# Patient Record
Sex: Male | Born: 2005 | Race: Black or African American | Hispanic: No | Marital: Single | Smoking: Never smoker
Health system: Southern US, Community
[De-identification: ages and names within clinical notes are randomized; demographics above are authoritative.]

## PROBLEM LIST (undated history)

## (undated) DIAGNOSIS — N62 Hypertrophy of breast: Secondary | ICD-10-CM

## (undated) DIAGNOSIS — J302 Other seasonal allergic rhinitis: Secondary | ICD-10-CM

## (undated) DIAGNOSIS — S83519A Sprain of anterior cruciate ligament of unspecified knee, initial encounter: Secondary | ICD-10-CM

## (undated) HISTORY — PX: NO PAST SURGERIES: SHX2092

---
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
## 2023-04-13 ENCOUNTER — Encounter (HOSPITAL_BASED_OUTPATIENT_CLINIC_OR_DEPARTMENT_OTHER): Payer: Self-pay | Admitting: Student

## 2023-04-13 ENCOUNTER — Ambulatory Visit (HOSPITAL_BASED_OUTPATIENT_CLINIC_OR_DEPARTMENT_OTHER): Payer: Medicaid Other

## 2023-04-13 ENCOUNTER — Ambulatory Visit (INDEPENDENT_AMBULATORY_CARE_PROVIDER_SITE_OTHER): Payer: BC Managed Care – PPO | Admitting: Student

## 2023-04-13 DIAGNOSIS — M25561 Pain in right knee: Secondary | ICD-10-CM

## 2023-04-13 NOTE — Progress Notes (Signed)
Chief Complaint: Right knee pain     History of Present Illness:    Nathaniel Johnson is a 17 y.o. male presenting today for evaluation of right knee pain.  He was playing basketball last week and felt a pop in his knee while performing a twisting motion.  He had immediate 10/10 pain and swelling.  He states that the pain is 3/10 today and located mostly on the lateral side of the knee.  He has been taking Advil as well as icing and wearing a compression knee brace.  Has only had 1 episode of his knee buckling.  His knee also occasionally pops.  He does have history of injury to this knee with a patella dislocation about 2 years ago.  Reports that has not dislocated since.   Surgical History:   None  PMH/PSH/Family History/Social History/Meds/Allergies:    Past Medical History:  Diagnosis Date   Gynecomastia 01/01/2017   Seasonal allergies    History reviewed. No pertinent surgical history. Social History   Socioeconomic History   Marital status: Single    Spouse name: Not on file   Number of children: Not on file   Years of education: Not on file   Highest education level: Not on file  Occupational History   Not on file  Tobacco Use   Smoking status: Never   Smokeless tobacco: Never  Substance and Sexual Activity   Alcohol use: No   Drug use: No   Sexual activity: Not on file  Other Topics Concern   Not on file  Social History Narrative   Not on file   Social Determinants of Health   Financial Resource Strain: Not on file  Food Insecurity: Not on file  Transportation Needs: Not on file  Physical Activity: Not on file  Stress: Not on file  Social Connections: Not on file   Family History  Problem Relation Age of Onset   Hypertension Mother    Allergies  Allergen Reactions   Pollen Extract     hives   No current outpatient medications on file.   No current facility-administered medications for this visit.   No results  found.  Review of Systems:   A ROS was performed including pertinent positives and negatives as documented in the HPI.  Physical Exam :   Constitutional: NAD and appears stated age Neurological: Alert and oriented Psych: Appropriate affect and cooperative There were no vitals taken for this visit.   Comprehensive Musculoskeletal Exam:    Swelling noted of the right knee without obvious deformity.  There is lateral joint line tenderness.  Active range of motion 0 to 110 degrees limited by pain.  Patella is nontender and mobile.  No laxity with varus or valgus stress.  Negative Lachman's.  Positive lateral McMurray's.  Distal neurosensory exam intact.  Imaging:   Xray (right knee 4 views): Negative for fracture or dislocation.  Moderate effusion present.   I personally reviewed and interpreted the radiographs.   Assessment:   17 y.o. male with acute knee pain after a basketball injury last week.  His knee does feel stable from a ligamentous standpoint however I do have concern for possible lateral meniscus tear.  I would like to proceed at this point with MRI of the right knee for further evaluation.  Encouraged continued  use of anti-inflammatories, ice, and compression.  Recommend to avoid high intensity activities, particularly those involving twisting or quick change of direction.  Will plan to follow back up in clinic in about 4 weeks for MRI review  Plan :    -MRI of the right knee and follow-up in 4 weeks for MRI review     I personally saw and evaluated the patient, and participated in the management and treatment plan.  Hazle Nordmann, PA-C Orthopedics  This document was dictated using Conservation officer, historic buildings. A reasonable attempt at proof reading has been made to minimize errors.

## 2023-04-18 ENCOUNTER — Telehealth: Payer: Self-pay

## 2023-04-18 NOTE — Telephone Encounter (Signed)
Sending mychart msg. AS, CMA 

## 2023-04-28 ENCOUNTER — Ambulatory Visit
Admission: RE | Admit: 2023-04-28 | Discharge: 2023-04-28 | Disposition: A | Discharge status: Home / Self Care | Payer: Medicaid Other | Source: Ambulatory Visit | Attending: Student | Admitting: Student

## 2023-04-28 DIAGNOSIS — S83511A Sprain of anterior cruciate ligament of right knee, initial encounter: Secondary | ICD-10-CM | POA: Diagnosis not present

## 2023-04-28 DIAGNOSIS — M25561 Pain in right knee: Secondary | ICD-10-CM

## 2023-05-10 ENCOUNTER — Ambulatory Visit (INDEPENDENT_AMBULATORY_CARE_PROVIDER_SITE_OTHER): Payer: BC Managed Care – PPO | Admitting: Orthopaedic Surgery

## 2023-05-10 ENCOUNTER — Ambulatory Visit (HOSPITAL_BASED_OUTPATIENT_CLINIC_OR_DEPARTMENT_OTHER): Payer: Self-pay | Admitting: Orthopaedic Surgery

## 2023-05-10 DIAGNOSIS — S83511A Sprain of anterior cruciate ligament of right knee, initial encounter: Secondary | ICD-10-CM | POA: Diagnosis not present

## 2023-05-10 NOTE — Progress Notes (Signed)
Chief Complaint: Right knee pain        History of Present Illness:    05/10/2023: Today for follow-up of his right knee.  He is having persistent instability.  He is here for MRI discussion   Nathaniel Johnson is a 17 y.o. male presenting today for evaluation of right knee pain.  He was playing basketball last week and felt a pop in his knee while performing a twisting motion.  He had immediate 10/10 pain and swelling.  He states that the pain is 3/10 today and located mostly on the lateral side of the knee.  He has been taking Advil as well as icing and wearing a compression knee brace.  Has only had 1 episode of his knee buckling.  His knee also occasionally pops.  He does have history of injury to this knee with a patella dislocation about 2 years ago.  Reports that has not dislocated since.     Surgical History:   None   PMH/PSH/Family History/Social History/Meds/Allergies:         Past Medical History:  Diagnosis Date   Gynecomastia 01/01/2017   Seasonal allergies      History reviewed. No pertinent surgical history. Social History         Socioeconomic History   Marital status: Single      Spouse name: Not on file   Number of children: Not on file   Years of education: Not on file   Highest education level: Not on file  Occupational History   Not on file  Tobacco Use   Smoking status: Never   Smokeless tobacco: Never  Substance and Sexual Activity   Alcohol use: No   Drug use: No   Sexual activity: Not on file  Other Topics Concern   Not on file  Social History Narrative   Not on file    Social Determinants of Health    Financial Resource Strain: Not on file  Food Insecurity: Not on file  Transportation Needs: Not on file  Physical Activity: Not on file  Stress: Not on file  Social Connections: Not on file         Family History  Problem Relation Age of Onset   Hypertension Mother           Allergies  Allergen  Reactions   Pollen Extract        hives    No current outpatient medications on file.    No current facility-administered medications for this visit.    Imaging Results (Last 48 hours)  No results found.     Review of Systems:   A ROS was performed including pertinent positives and negatives as documented in the HPI.   Physical Exam :   Constitutional: NAD and appears stated age Neurological: Alert and oriented Psych: Appropriate affect and cooperative There were no vitals taken for this visit.    Comprehensive Musculoskeletal Exam:     Improved swelling about the right knee.  No joint line tenderness.  Active range of motion 0 to 130 degrees limited by pain.  Patella is nontender and mobile.  No laxity with varus or valgus stress.  Positive Lachman's grade 3A with positive pivot shift.  Distal neurosensory exam intact.   Imaging:   Xray (right knee 4 views): Negative  for fracture or dislocation.  Moderate effusion present.   MRI right knee: Full-thickness ACL tear I personally reviewed and interpreted the radiographs.     Assessment:   17 y.o. male with a right knee complete ACL tear after feeling a pop during a pivot injury and basketball.  I did discuss treatment options with him today.  Given the fact that he is unstable and does appear to have high aspirations of playing college basketball I do believe that right knee ACL reconstruction with quadriceps autograft give him the best option for return to play without residual instability.  I did discuss the specific limitations and restrictions associated with surgery.  We did discuss graft options and after completely exhausted discussion he is elected for quadriceps tendon autograft.   Plan :     -Plan for right knee arthroscopy with anterior cruciate ligament reconstruction with quadriceps tendon autograft       After a lengthy discussion of treatment options, including risks, benefits, alternatives, complications of  surgical and nonsurgical conservative options, the patient elected surgical repair.   The patient  is aware of the material risks  and complications including, but not limited to injury to adjacent structures, neurovascular injury, infection, numbness, bleeding, implant failure, thermal burns, stiffness, persistent pain, failure to heal, disease transmission from allograft, need for further surgery, dislocation, anesthetic risks, blood clots, risks of death,and others. The probabilities of surgical success and failure discussed with patient given their particular co-morbidities.The time and nature of expected rehabilitation and recovery was discussed.The patient's questions were all answered preoperatively.  No barriers to understanding were noted. I explained the natural history of the disease process and Rx rationale.  I explained to the patient what I considered to be reasonable expectations given their personal situation.  The final treatment plan was arrived at through a shared patient decision making process model.    I personally saw and evaluated the patient, and participated in the management and treatment plan.

## 2023-05-10 NOTE — H&P (View-Only) (Signed)
                                Chief Complaint: Right knee pain        History of Present Illness:    05/10/2023: Today for follow-up of his right knee.  He is having persistent instability.  He is here for MRI discussion   Nathaniel Johnson is a 17 y.o. male presenting today for evaluation of right knee pain.  He was playing basketball last week and felt a pop in his knee while performing a twisting motion.  He had immediate 10/10 pain and swelling.  He states that the pain is 3/10 today and located mostly on the lateral side of the knee.  He has been taking Advil as well as icing and wearing a compression knee brace.  Has only had 1 episode of his knee buckling.  His knee also occasionally pops.  He does have history of injury to this knee with a patella dislocation about 2 years ago.  Reports that has not dislocated since.     Surgical History:   None   PMH/PSH/Family History/Social History/Meds/Allergies:         Past Medical History:  Diagnosis Date   Gynecomastia 01/01/2017   Seasonal allergies      History reviewed. No pertinent surgical history. Social History         Socioeconomic History   Marital status: Single      Spouse name: Not on file   Number of children: Not on file   Years of education: Not on file   Highest education level: Not on file  Occupational History   Not on file  Tobacco Use   Smoking status: Never   Smokeless tobacco: Never  Substance and Sexual Activity   Alcohol use: No   Drug use: No   Sexual activity: Not on file  Other Topics Concern   Not on file  Social History Narrative   Not on file    Social Determinants of Health    Financial Resource Strain: Not on file  Food Insecurity: Not on file  Transportation Needs: Not on file  Physical Activity: Not on file  Stress: Not on file  Social Connections: Not on file         Family History  Problem Relation Age of Onset   Hypertension Mother           Allergies  Allergen  Reactions   Pollen Extract        hives    No current outpatient medications on file.    No current facility-administered medications for this visit.    Imaging Results (Last 48 hours)  No results found.     Review of Systems:   A ROS was performed including pertinent positives and negatives as documented in the HPI.   Physical Exam :   Constitutional: NAD and appears stated age Neurological: Alert and oriented Psych: Appropriate affect and cooperative There were no vitals taken for this visit.    Comprehensive Musculoskeletal Exam:     Improved swelling about the right knee.  No joint line tenderness.  Active range of motion 0 to 130 degrees limited by pain.  Patella is nontender and mobile.  No laxity with varus or valgus stress.  Positive Lachman's grade 3A with positive pivot shift.  Distal neurosensory exam intact.   Imaging:   Xray (right knee 4 views): Negative   for fracture or dislocation.  Moderate effusion present.   MRI right knee: Full-thickness ACL tear I personally reviewed and interpreted the radiographs.     Assessment:   17 y.o. male with a right knee complete ACL tear after feeling a pop during a pivot injury and basketball.  I did discuss treatment options with him today.  Given the fact that he is unstable and does appear to have high aspirations of playing college basketball I do believe that right knee ACL reconstruction with quadriceps autograft give him the best option for return to play without residual instability.  I did discuss the specific limitations and restrictions associated with surgery.  We did discuss graft options and after completely exhausted discussion he is elected for quadriceps tendon autograft.   Plan :     -Plan for right knee arthroscopy with anterior cruciate ligament reconstruction with quadriceps tendon autograft       After a lengthy discussion of treatment options, including risks, benefits, alternatives, complications of  surgical and nonsurgical conservative options, the patient elected surgical repair.   The patient  is aware of the material risks  and complications including, but not limited to injury to adjacent structures, neurovascular injury, infection, numbness, bleeding, implant failure, thermal burns, stiffness, persistent pain, failure to heal, disease transmission from allograft, need for further surgery, dislocation, anesthetic risks, blood clots, risks of death,and others. The probabilities of surgical success and failure discussed with patient given their particular co-morbidities.The time and nature of expected rehabilitation and recovery was discussed.The patient's questions were all answered preoperatively.  No barriers to understanding were noted. I explained the natural history of the disease process and Rx rationale.  I explained to the patient what I considered to be reasonable expectations given their personal situation.  The final treatment plan was arrived at through a shared patient decision making process model.    I personally saw and evaluated the patient, and participated in the management and treatment plan.   

## 2023-05-15 ENCOUNTER — Other Ambulatory Visit (HOSPITAL_BASED_OUTPATIENT_CLINIC_OR_DEPARTMENT_OTHER): Payer: Self-pay | Admitting: Orthopaedic Surgery

## 2023-05-15 DIAGNOSIS — S83511A Sprain of anterior cruciate ligament of right knee, initial encounter: Secondary | ICD-10-CM

## 2023-05-16 ENCOUNTER — Encounter (HOSPITAL_BASED_OUTPATIENT_CLINIC_OR_DEPARTMENT_OTHER): Payer: Self-pay

## 2023-05-21 ENCOUNTER — Other Ambulatory Visit: Payer: Self-pay

## 2023-05-21 ENCOUNTER — Encounter (HOSPITAL_BASED_OUTPATIENT_CLINIC_OR_DEPARTMENT_OTHER): Payer: Self-pay | Admitting: Orthopaedic Surgery

## 2023-05-24 NOTE — Progress Notes (Signed)

## 2023-05-28 NOTE — Anesthesia Preprocedure Evaluation (Signed)
Anesthesia Evaluation  Patient identified by MRN, date of birth, ID band Patient awake and Patient confused    Reviewed: Allergy & Precautions, H&P , NPO status , Patient's Chart, lab work & pertinent test results  Airway Mallampati: I  TM Distance: >3 FB Neck ROM: Full    Dental no notable dental hx. (+) Teeth Intact, Dental Advisory Given   Pulmonary neg pulmonary ROS   Pulmonary exam normal breath sounds clear to auscultation       Cardiovascular negative cardio ROS Normal cardiovascular exam Rhythm:Regular Rate:Normal     Neuro/Psych negative neurological ROS  negative psych ROS   GI/Hepatic negative GI ROS, Neg liver ROS,,,  Endo/Other  negative endocrine ROS    Renal/GU negative Renal ROS  negative genitourinary   Musculoskeletal negative musculoskeletal ROS (+)    Abdominal   Peds negative pediatric ROS (+)  Hematology negative hematology ROS (+)   Anesthesia Other Findings   Reproductive/Obstetrics negative OB ROS                             Anesthesia Physical Anesthesia Plan  ASA: 1  Anesthesia Plan: General and Regional   Post-op Pain Management: Regional block*, Precedex and Ofirmev IV (intra-op)*   Induction: Intravenous  PONV Risk Score and Plan: Ondansetron, Midazolam and Treatment may vary due to age or medical condition  Airway Management Planned: LMA  Additional Equipment: None  Intra-op Plan:   Post-operative Plan:   Informed Consent: I have reviewed the patients History and Physical, chart, labs and discussed the procedure including the risks, benefits and alternatives for the proposed anesthesia with the patient or authorized representative who has indicated his/her understanding and acceptance.     Dental advisory given  Plan Discussed with: CRNA and Anesthesiologist  Anesthesia Plan Comments: (GA w R adductor)        Anesthesia Quick  Evaluation

## 2023-05-29 ENCOUNTER — Ambulatory Visit (HOSPITAL_BASED_OUTPATIENT_CLINIC_OR_DEPARTMENT_OTHER): Payer: BC Managed Care – PPO | Admitting: Anesthesiology

## 2023-05-29 ENCOUNTER — Encounter (HOSPITAL_BASED_OUTPATIENT_CLINIC_OR_DEPARTMENT_OTHER)
Admission: RE | Disposition: A | Discharge status: Home / Self Care | Payer: Self-pay | Source: Home / Self Care | Attending: Orthopaedic Surgery

## 2023-05-29 ENCOUNTER — Ambulatory Visit (HOSPITAL_BASED_OUTPATIENT_CLINIC_OR_DEPARTMENT_OTHER): Payer: BC Managed Care – PPO

## 2023-05-29 ENCOUNTER — Ambulatory Visit (HOSPITAL_BASED_OUTPATIENT_CLINIC_OR_DEPARTMENT_OTHER)
Admission: RE | Admit: 2023-05-29 | Discharge: 2023-05-29 | Disposition: A | Discharge status: Home / Self Care | Payer: BC Managed Care – PPO | Attending: Orthopaedic Surgery | Admitting: Orthopaedic Surgery

## 2023-05-29 ENCOUNTER — Other Ambulatory Visit: Payer: Self-pay

## 2023-05-29 ENCOUNTER — Encounter (HOSPITAL_BASED_OUTPATIENT_CLINIC_OR_DEPARTMENT_OTHER): Payer: Self-pay | Admitting: Orthopaedic Surgery

## 2023-05-29 DIAGNOSIS — S83511A Sprain of anterior cruciate ligament of right knee, initial encounter: Secondary | ICD-10-CM | POA: Diagnosis not present

## 2023-05-29 DIAGNOSIS — X501XXA Overexertion from prolonged static or awkward postures, initial encounter: Secondary | ICD-10-CM | POA: Diagnosis not present

## 2023-05-29 DIAGNOSIS — S83261A Peripheral tear of lateral meniscus, current injury, right knee, initial encounter: Secondary | ICD-10-CM

## 2023-05-29 DIAGNOSIS — S83281A Other tear of lateral meniscus, current injury, right knee, initial encounter: Secondary | ICD-10-CM | POA: Insufficient documentation

## 2023-05-29 DIAGNOSIS — G8918 Other acute postprocedural pain: Secondary | ICD-10-CM | POA: Diagnosis not present

## 2023-05-29 DIAGNOSIS — Y9367 Activity, basketball: Secondary | ICD-10-CM | POA: Diagnosis not present

## 2023-05-29 HISTORY — DX: Sprain of anterior cruciate ligament of unspecified knee, initial encounter: S83.519A

## 2023-05-29 HISTORY — PX: KNEE ARTHROSCOPY WITH ANTERIOR CRUCIATE LIGAMENT (ACL) REPAIR WITH HAMSTRING GRAFT: SHX5645

## 2023-05-29 HISTORY — PX: KNEE ARTHROSCOPY WITH MENISCAL REPAIR: SHX5653

## 2023-05-29 SURGERY — KNEE ARTHROSCOPY WITH ANTERIOR CRUCIATE LIGAMENT (ACL) REPAIR WITH HAMSTRING GRAFT
Anesthesia: Regional | Site: Knee | Laterality: Right

## 2023-05-29 MED ORDER — ONDANSETRON HCL 4 MG/2ML IJ SOLN
4.0000 mg | Freq: Once | INTRAMUSCULAR | Status: AC
  Administered 2023-05-29: 4 mg via INTRAVENOUS

## 2023-05-29 MED ORDER — LIDOCAINE 2% (20 MG/ML) 5 ML SYRINGE
INTRAMUSCULAR | Status: AC
  Filled 2023-05-29: qty 5

## 2023-05-29 MED ORDER — OXYCODONE HCL 5 MG PO TABS: 5.0000 mg | ORAL_TABLET | Freq: Once | ORAL | Status: DC | PRN

## 2023-05-29 MED ORDER — MIDAZOLAM HCL 2 MG/2ML IJ SOLN
2.0000 mg | Freq: Once | INTRAMUSCULAR | Status: AC
  Administered 2023-05-29: 2 mg via INTRAVENOUS

## 2023-05-29 MED ORDER — LIDOCAINE 2% (20 MG/ML) 5 ML SYRINGE
INTRAMUSCULAR | Status: DC | PRN
  Administered 2023-05-29: 100 mg via INTRAVENOUS

## 2023-05-29 MED ORDER — ACETAMINOPHEN 500 MG PO TABS
ORAL_TABLET | ORAL | Status: AC
  Filled 2023-05-29: qty 2

## 2023-05-29 MED ORDER — MIDAZOLAM HCL 2 MG/2ML IJ SOLN
INTRAMUSCULAR | Status: AC
  Filled 2023-05-29: qty 2

## 2023-05-29 MED ORDER — DEXMEDETOMIDINE HCL IN NACL 80 MCG/20ML IV SOLN: INTRAVENOUS | Status: DC | PRN

## 2023-05-29 MED ORDER — SODIUM CHLORIDE 0.9 % IR SOLN
Status: DC | PRN
  Administered 2023-05-29: 9000 mL

## 2023-05-29 MED ORDER — ARTIFICIAL TEARS OPHTHALMIC OINT
TOPICAL_OINTMENT | OPHTHALMIC | Status: AC
  Filled 2023-05-29: qty 3.5

## 2023-05-29 MED ORDER — DEXAMETHASONE SODIUM PHOSPHATE 10 MG/ML IJ SOLN
INTRAMUSCULAR | Status: AC
  Filled 2023-05-29: qty 1

## 2023-05-29 MED ORDER — FENTANYL CITRATE (PF) 100 MCG/2ML IJ SOLN
INTRAMUSCULAR | Status: AC
  Filled 2023-05-29: qty 2

## 2023-05-29 MED ORDER — FENTANYL CITRATE (PF) 100 MCG/2ML IJ SOLN
100.0000 ug | Freq: Once | INTRAMUSCULAR | Status: AC
  Administered 2023-05-29: 100 ug via INTRAVENOUS

## 2023-05-29 MED ORDER — ONDANSETRON HCL 4 MG/2ML IJ SOLN
INTRAMUSCULAR | Status: AC
  Filled 2023-05-29: qty 2

## 2023-05-29 MED ORDER — PROPOFOL 10 MG/ML IV BOLUS
INTRAVENOUS | Status: DC | PRN
  Administered 2023-05-29: 20 mg via INTRAVENOUS
  Administered 2023-05-29: 180 mg via INTRAVENOUS

## 2023-05-29 MED ORDER — AMISULPRIDE (ANTIEMETIC) 5 MG/2ML IV SOLN
INTRAVENOUS | Status: AC
  Filled 2023-05-29: qty 4

## 2023-05-29 MED ORDER — PROPOFOL 10 MG/ML IV BOLUS
INTRAVENOUS | Status: AC
  Filled 2023-05-29: qty 20

## 2023-05-29 MED ORDER — ASPIRIN 325 MG PO TBEC: 325.0000 mg | DELAYED_RELEASE_TABLET | Freq: Every day | ORAL | 0 refills | Status: AC

## 2023-05-29 MED ORDER — GABAPENTIN 300 MG PO CAPS
300.0000 mg | ORAL_CAPSULE | Freq: Once | ORAL | Status: AC
  Administered 2023-05-29: 300 mg via ORAL

## 2023-05-29 MED ORDER — CEFAZOLIN SODIUM-DEXTROSE 2-4 GM/100ML-% IV SOLN
2.0000 g | INTRAVENOUS | Status: AC
  Administered 2023-05-29: 2 g via INTRAVENOUS

## 2023-05-29 MED ORDER — ACETAMINOPHEN 500 MG PO TABS: 500.0000 mg | ORAL_TABLET | Freq: Three times a day (TID) | ORAL | 0 refills | Status: AC

## 2023-05-29 MED ORDER — AMISULPRIDE (ANTIEMETIC) 5 MG/2ML IV SOLN
10.0000 mg | Freq: Once | INTRAVENOUS | Status: AC
  Administered 2023-05-29: 10 mg via INTRAVENOUS

## 2023-05-29 MED ORDER — OXYCODONE HCL 5 MG/5ML PO SOLN: 5.0000 mg | Freq: Once | ORAL | Status: DC | PRN

## 2023-05-29 MED ORDER — ACETAMINOPHEN 500 MG PO TABS
1000.0000 mg | ORAL_TABLET | Freq: Once | ORAL | Status: AC
  Administered 2023-05-29: 1000 mg via ORAL

## 2023-05-29 MED ORDER — EPHEDRINE 5 MG/ML INJ
INTRAVENOUS | Status: AC
  Filled 2023-05-29: qty 15

## 2023-05-29 MED ORDER — LACTATED RINGERS IV SOLN: INTRAVENOUS | Status: DC

## 2023-05-29 MED ORDER — TRANEXAMIC ACID-NACL 1000-0.7 MG/100ML-% IV SOLN
1000.0000 mg | INTRAVENOUS | Status: AC
  Administered 2023-05-29: 1000 mg via INTRAVENOUS

## 2023-05-29 MED ORDER — KETOROLAC TROMETHAMINE 30 MG/ML IJ SOLN: 30.0000 mg | Freq: Once | INTRAMUSCULAR | Status: DC | PRN

## 2023-05-29 MED ORDER — ONDANSETRON HCL 4 MG/2ML IJ SOLN
4.0000 mg | Freq: Once | INTRAMUSCULAR | Status: AC | PRN
  Administered 2023-05-29: 4 mg via INTRAVENOUS

## 2023-05-29 MED ORDER — IBUPROFEN 800 MG PO TABS: 800.0000 mg | ORAL_TABLET | Freq: Three times a day (TID) | ORAL | 0 refills | Status: AC

## 2023-05-29 MED ORDER — DEXAMETHASONE SODIUM PHOSPHATE 10 MG/ML IJ SOLN
INTRAMUSCULAR | Status: DC | PRN
  Administered 2023-05-29: 4 mg via INTRAVENOUS

## 2023-05-29 MED ORDER — GABAPENTIN 300 MG PO CAPS
ORAL_CAPSULE | ORAL | Status: AC
  Filled 2023-05-29: qty 1

## 2023-05-29 MED ORDER — FENTANYL CITRATE (PF) 250 MCG/5ML IJ SOLN
INTRAMUSCULAR | Status: DC | PRN
  Administered 2023-05-29: 25 ug via INTRAVENOUS
  Administered 2023-05-29: 50 ug via INTRAVENOUS
  Administered 2023-05-29: 25 ug via INTRAVENOUS

## 2023-05-29 MED ORDER — EPHEDRINE SULFATE-NACL 50-0.9 MG/10ML-% IV SOSY
PREFILLED_SYRINGE | INTRAVENOUS | Status: DC | PRN
  Administered 2023-05-29: 5 mg via INTRAVENOUS
  Administered 2023-05-29: 10 mg via INTRAVENOUS
  Administered 2023-05-29: 5 mg via INTRAVENOUS

## 2023-05-29 MED ORDER — ROPIVACAINE HCL 5 MG/ML IJ SOLN
INTRAMUSCULAR | Status: DC | PRN
  Administered 2023-05-29: 30 mL via PERINEURAL

## 2023-05-29 MED ORDER — VANCOMYCIN HCL 1000 MG IV SOLR
INTRAVENOUS | Status: DC | PRN
  Administered 2023-05-29: 1000 mg via TOPICAL

## 2023-05-29 MED ORDER — TRANEXAMIC ACID-NACL 1000-0.7 MG/100ML-% IV SOLN
INTRAVENOUS | Status: AC
  Filled 2023-05-29: qty 100

## 2023-05-29 MED ORDER — HYDROMORPHONE HCL 1 MG/ML IJ SOLN: 0.2500 mg | INTRAMUSCULAR | Status: DC | PRN

## 2023-05-29 MED ORDER — CEFAZOLIN SODIUM-DEXTROSE 2-4 GM/100ML-% IV SOLN
INTRAVENOUS | Status: AC
  Filled 2023-05-29: qty 100

## 2023-05-29 MED ORDER — DEXMEDETOMIDINE HCL IN NACL 80 MCG/20ML IV SOLN
INTRAVENOUS | Status: DC | PRN
  Administered 2023-05-29: 8 ug via INTRAVENOUS
  Administered 2023-05-29: 12 ug via INTRAVENOUS

## 2023-05-29 MED ORDER — CLONIDINE HCL (ANALGESIA) 100 MCG/ML EP SOLN
EPIDURAL | Status: DC | PRN
  Administered 2023-05-29: 100 ug

## 2023-05-29 MED ORDER — OXYCODONE HCL 5 MG PO TABS: 5.0000 mg | ORAL_TABLET | ORAL | 0 refills | Status: DC | PRN

## 2023-05-29 SURGICAL SUPPLY — 89 items
ANCH SUT 1.5 2-0 CVD FBRSTCH (Anchor) ×1 IMPLANT
ANCHOR BUTTON TIGHTROPE 14 (Anchor) IMPLANT
APL PRP STRL LF DISP 70% ISPRP (MISCELLANEOUS) ×1
BLADE SHAVER BONE 5.0X13 (MISCELLANEOUS) IMPLANT
BLADE SHAVER TORPEDO 4X13 (MISCELLANEOUS) IMPLANT
BLADE SURG 15 STRL LF DISP TIS (BLADE) ×2 IMPLANT
BLADE SURG 15 STRL SS (BLADE) ×2
BNDG CMPR 5X4 CHSV STRCH STRL (GAUZE/BANDAGES/DRESSINGS)
BNDG CMPR 5X62 HK CLSR LF (GAUZE/BANDAGES/DRESSINGS) ×1
BNDG CMPR 6"X 5 YARDS HK CLSR (GAUZE/BANDAGES/DRESSINGS) ×1
BNDG COHESIVE 4X5 TAN STRL LF (GAUZE/BANDAGES/DRESSINGS) IMPLANT
BNDG ELASTIC 6INX 5YD STR LF (GAUZE/BANDAGES/DRESSINGS) ×1 IMPLANT
CHLORAPREP W/TINT 26 (MISCELLANEOUS) ×1 IMPLANT
COOLER ICEMAN CLASSIC (MISCELLANEOUS) ×1 IMPLANT
COVER BACK TABLE 60X90IN (DRAPES) ×1 IMPLANT
CUFF TOURN SGL QUICK 34 (TOURNIQUET CUFF)
CUFF TRNQT CYL 34X4.125X (TOURNIQUET CUFF) IMPLANT
CUTTER TENSIONER SUT 2-0 0 FBW (INSTRUMENTS) IMPLANT
DISSECTOR 4.0MM X 13CM (MISCELLANEOUS) ×1 IMPLANT
DRAPE OEC MINIVIEW 54X84 (DRAPES) ×1 IMPLANT
DRAPE U-SHAPE 47X51 STRL (DRAPES) ×1 IMPLANT
DRAPE-T ARTHROSCOPY W/POUCH (DRAPES) ×1 IMPLANT
DRILL FLIPCUTTER III 6-12 (ORTHOPEDIC DISPOSABLE SUPPLIES) ×1 IMPLANT
DW OUTFLOW CASSETTE/TUBE SET (MISCELLANEOUS) ×1 IMPLANT
ELECT REM PT RETURN 9FT ADLT (ELECTROSURGICAL) ×1
ELECTRODE REM PT RTRN 9FT ADLT (ELECTROSURGICAL) ×1 IMPLANT
FIBERSTICK 2 (SUTURE) ×1 IMPLANT
FLIPCUTTER III 6-12 AR-1204FF (ORTHOPEDIC DISPOSABLE SUPPLIES)
GAUZE SPONGE 4X4 12PLY STRL (GAUZE/BANDAGES/DRESSINGS) ×1 IMPLANT
GAUZE XEROFORM 1X8 LF (GAUZE/BANDAGES/DRESSINGS) ×1 IMPLANT
GLOVE BIO SURGEON STRL SZ 6 (GLOVE) ×2 IMPLANT
GLOVE BIO SURGEON STRL SZ7.5 (GLOVE) ×1 IMPLANT
GLOVE BIOGEL PI IND STRL 6.5 (GLOVE) ×1 IMPLANT
GLOVE BIOGEL PI IND STRL 8 (GLOVE) ×1 IMPLANT
GOWN STRL REUS W/ TWL LRG LVL3 (GOWN DISPOSABLE) ×2 IMPLANT
GOWN STRL REUS W/TWL LRG LVL3 (GOWN DISPOSABLE) ×2
GOWN STRL REUS W/TWL XL LVL3 (GOWN DISPOSABLE) ×1 IMPLANT
HARVESTER TENDON QUADPRO 11 (ORTHOPEDIC DISPOSABLE SUPPLIES) IMPLANT
HARVESTER TENDON QUADPRO 8 (ORTHOPEDIC DISPOSABLE SUPPLIES) IMPLANT
HARVESTER TENDON QUADPRO 9 (ORTHOPEDIC DISPOSABLE SUPPLIES) IMPLANT
IMMOBILIZER KNEE 20 (SOFTGOODS)
IMMOBILIZER KNEE 20 THIGH 36 (SOFTGOODS) IMPLANT
IMMOBILIZER KNEE 22 UNIV (SOFTGOODS) IMPLANT
IMP SYS 2ND FIX PEEK 4.75X19.1 (Miscellaneous) ×1 IMPLANT
IMPL FIBERSTITCH 1.5 CVD (Anchor) IMPLANT
IMPL QUADLINK SYSTEM 10 (Orthopedic Implant) IMPLANT
IMPL SYS 2ND FX PEEK 4.75X19.1 (Miscellaneous) IMPLANT
IMPLANT FIBERSTITCH 1.5 CVD (Anchor) ×1 IMPLANT
IMPLANT QUADLINK SYSTEM 10 (Orthopedic Implant) ×1 IMPLANT
IV NS IRRIG 3000ML ARTHROMATIC (IV SOLUTION) ×4 IMPLANT
KIT TRANSTIBIAL (DISPOSABLE) ×1 IMPLANT
KNIFE GRAFT ACL 10MM 5952 (MISCELLANEOUS) IMPLANT
KNIFE GRAFT ACL 9MM (MISCELLANEOUS) IMPLANT
NDL SUT 2-0 SCORPION KNEE (NEEDLE) ×1 IMPLANT
NEEDLE SUT 2-0 SCORPION KNEE (NEEDLE) ×2 IMPLANT
NS IRRIG 1000ML POUR BTL (IV SOLUTION) ×1 IMPLANT
PACK ARTHROSCOPY DSU (CUSTOM PROCEDURE TRAY) ×1 IMPLANT
PACK BASIN DAY SURGERY FS (CUSTOM PROCEDURE TRAY) IMPLANT
PAD CAST 4YDX4 CTTN HI CHSV (CAST SUPPLIES) ×1 IMPLANT
PAD COLD SHLDR WRAP-ON (PAD) ×1 IMPLANT
PADDING CAST COTTON 4X4 STRL (CAST SUPPLIES) ×1
PENCIL SMOKE EVACUATOR (MISCELLANEOUS) ×1 IMPLANT
PORT APPOLLO RF 90DEGREE MULTI (SURGICAL WAND) ×1 IMPLANT
PORTAL SKID DEVICE (INSTRUMENTS) IMPLANT
SHEET MEDIUM DRAPE 40X70 STRL (DRAPES) ×1 IMPLANT
SLEEVE SCD COMPRESS KNEE MED (STOCKING) ×1 IMPLANT
SPIKE FLUID TRANSFER (MISCELLANEOUS) IMPLANT
SPONGE T-LAP 4X18 ~~LOC~~+RFID (SPONGE) ×1 IMPLANT
SUT 0 FIBERLOOP 38 BLUE TPR ND (SUTURE)
SUT 2 FIBERLOOP 20 STRT BLUE (SUTURE) ×2
SUT ETHILON 3 0 PS 1 (SUTURE) ×2 IMPLANT
SUT VIC AB 0 CT1 27 (SUTURE) ×2
SUT VIC AB 0 CT1 27XBRD ANBCTR (SUTURE) ×3 IMPLANT
SUT VIC AB 1 CT1 27 (SUTURE) ×1
SUT VIC AB 1 CT1 27XBRD ANBCTR (SUTURE) ×1 IMPLANT
SUT VIC AB 2-0 PS2 27 (SUTURE) ×2 IMPLANT
SUT VIC AB 2-0 SH 27 (SUTURE) ×2
SUT VIC AB 2-0 SH 27XBRD (SUTURE) IMPLANT
SUT VIC AB 3-0 SH 27 (SUTURE)
SUT VIC AB 3-0 SH 27X BRD (SUTURE) IMPLANT
SUTURE 0 FIBERLP 38 BLU TPR ND (SUTURE) ×1 IMPLANT
SUTURE 2 FIBERLOOP 20 STRT BLU (SUTURE) IMPLANT
SUTURE TAPE TIGERLINK 1.3MM BL (SUTURE) IMPLANT
SUTURETAPE TIGERLINK 1.3MM BL (SUTURE)
SYR BULB IRRIG 60ML STRL (SYRINGE) ×1 IMPLANT
TOWEL GREEN STERILE FF (TOWEL DISPOSABLE) ×2 IMPLANT
TUBE CONNECTING 20X1/4 (TUBING) ×1 IMPLANT
TUBING ARTHROSCOPY IRRIG 16FT (MISCELLANEOUS) ×1 IMPLANT
YANKAUER SUCT BULB TIP NO VENT (SUCTIONS) ×1 IMPLANT

## 2023-05-29 NOTE — Interval H&P Note (Signed)
History and Physical Interval Note:  05/29/2023 9:10 AM  Nathaniel Johnson  has presented today for surgery, with the diagnosis of RIGHT KNEE ANTERIOR CRUCIATE LIGAMENT TEAR.  The various methods of treatment have been discussed with the patient and family. After consideration of risks, benefits and other options for treatment, the patient has consented to  Procedure(s): RIGHT KNEE ARTHROSCOPY WITH ANTERIOR CRUCIATE LIGAMENT (ACL) RECONSTRUCTION WITH QUADRICEPS AUTOGRAFT (Right) as a surgical intervention.  The patient's history has been reviewed, patient examined, no change in status, stable for surgery.  I have reviewed the patient's chart and labs.  Questions were answered to the patient's satisfaction.     [name]

## 2023-05-29 NOTE — Brief Op Note (Signed)
   Brief Op Note  Date of Surgery: 05/29/2023  Preoperative Diagnosis: RIGHT KNEE ANTERIOR CRUCIATE LIGAMENT TEAR  Postoperative Diagnosis: same  Procedure: Procedure(s): RIGHT KNEE ARTHROSCOPY WITH ANTERIOR CRUCIATE LIGAMENT (ACL) RECONSTRUCTION WITH QUADRICEPS AUTOGRAFT KNEE ARTHROSCOPY WITH MENISCAL REPAIR LATERAL  Implants: Implant Name Type Inv. Item Serial No. Manufacturer Lot No. LRB No. Used Action  IMPLANT FIBERSTITCH 1.5 CVD - [id number] Anchor IMPLANT FIBERSTITCH 1.5 CVD  [employer] [id number] Right 1 Implanted    Surgeons: Surgeon(s): [name], MD  Anesthesia: General    Estimated Blood Loss: See anesthesia record  Complications: None  Condition to PACU: Stable  Benancio Deeds, MD 05/29/2023 1:41 PM

## 2023-05-29 NOTE — Anesthesia Procedure Notes (Signed)
Anesthesia Regional Block: Adductor canal block   Pre-Anesthetic Checklist: , timeout performed,  Correct Patient, Correct Site, Correct Laterality,  Correct Procedure, Correct Position, site marked,  Risks and benefits discussed,  Surgical consent,  Pre-op evaluation,  At surgeon's request and post-op pain management  Laterality: Lower and Right  Prep: chloraprep       Needles:  Injection technique: Single-shot  Needle Type: Echogenic Needle     Needle Length: 9cm  Needle Gauge: 22     Additional Needles:   Procedures:,,,, ultrasound used (permanent image in chart),,    Narrative:  Start time: 05/29/2023 9:45 AM End time: 05/29/2023 9:50 AM Injection made incrementally with aspirations every 5 mL.  Performed by: Personally  Anesthesiologist: Trevor Iha, MD  Additional Notes: Block assessed prior to surgery. Pt tolerated procedure well.

## 2023-05-29 NOTE — Transfer of Care (Signed)
Immediate Anesthesia Transfer of Care Note  Patient: MAK BONNY  Procedure(s) Performed: RIGHT KNEE ARTHROSCOPY WITH ANTERIOR CRUCIATE LIGAMENT (ACL) RECONSTRUCTION WITH QUADRICEPS AUTOGRAFT (Right: Knee) KNEE ARTHROSCOPY WITH MENISCAL REPAIR LATERAL (Right: Knee)  Patient Location: PACU  Anesthesia Type:GA combined with regional for post-op pain  Level of Consciousness: awake, alert , and oriented  Airway & Oxygen Therapy: Patient Spontanous Breathing and Patient connected to face mask oxygen  Post-op Assessment: Report given to RN and Post -op Vital signs reviewed and stable  Post vital signs: Reviewed and stable  Last Vitals:  Vitals Value Taken Time  BP    Temp    Pulse    Resp    SpO2      Last Pain:  Vitals:   05/29/23 0905  PainSc: 0-No pain      Patients Stated Pain Goal: 3 (05/29/23 0905)  Complications: No notable events documented.

## 2023-05-29 NOTE — Discharge Instructions (Addendum)
Discharge Instructions    Attending Surgeon: [name], MD Office Phone Number: [phone]   Diagnosis and Procedures:    Surgeries Performed: Right knee ACL reconstruction with lateral meniscal repair  Discharge Plan:    Diet: Resume usual diet. Begin with light or bland foods.  Drink plenty of fluids.  Activity:  Weight bearing as tolerated right leg with brace locked in extension. You are advised to go home directly from the hospital or surgical center. Restrict your activities.  GENERAL INSTRUCTIONS: 1.  Please apply ice to your wound to help with swelling and inflammation. This will improve your comfort and your overall recovery following surgery.     2. Please call Dr. Serena Croissant office at [phone] with questions Monday-Friday during business hours. If no one answers, please leave a message and someone should get back to the patient within 24 hours. For emergencies please call 911 or proceed to the emergency room.   3. Patient to notify surgical team if experiences any of the following: Bowel/Bladder dysfunction, uncontrolled pain, nerve/muscle weakness, incision with increased drainage or redness, nausea/vomiting and Fever greater than 101.0 F.  Be alert for signs of infection including redness, streaking, odor, fever or chills. Be alert for excessive pain or bleeding and notify your surgeon immediately.  WOUND INSTRUCTIONS:   Leave your dressing, cast, or splint in place until your post operative visit.  Keep it clean and dry.  Always keep the incision clean and dry until the staples/sutures are removed. If there is no drainage from the incision you should keep it open to air. If there is drainage from the incision you must keep it covered at all times until the drainage stops  Do not soak in a bath tub, hot tub, pool, lake or other body of water until 21 days after your surgery and your incision is completely dry and healed.  If you have removable sutures  (or staples) they must be removed 10-14 days (unless otherwise instructed) from the day of your surgery.     1)  Elevate the extremity as much as possible.  2)  Keep the dressing clean and dry.  3)  Please call us if the dressing becomes wet or dirty.  4)  If you are experiencing worsening pain or worsening swelling, please call.     MEDICATIONS: Resume all previous home medications at the previous prescribed dose and frequency unless otherwise noted Start taking the  pain medications on an as-needed basis as prescribed  Please taper down pain medication over the next week following surgery.  Ideally you should not require a refill of any narcotic pain medication.  Take pain medication with food to minimize nausea. In addition to the prescribed pain medication, you may take over-the-counter pain relievers such as Tylenol.  Do NOT take additional tylenol if your pain medication already has tylenol in it.  Aspirin 325mg  daily for four weeks.      FOLLOWUP INSTRUCTIONS: 1. Follow up at the Physical Therapy Clinic 3-4 days following surgery. This appointment should be scheduled unless other arrangements have been made.The Physical Therapy scheduling number is [phone] if an appointment has not already been arranged.  2. Contact Dr. Serena Croissant office during office hours at [phone] or the practice after hours line at [phone] for non-emergencies. For medical emergencies call 911.   Discharge Location: Home   May take Tylenol after 3pm, if needed.   Post Anesthesia Home Care Instructions  Activity: Get plenty of rest for  the remainder of the day. A responsible individual must stay with you for 24 hours following the procedure.  For the next 24 hours, DO NOT: -Drive a car -Advertising copywriter -Drink alcoholic beverages -Take any medication unless instructed by your physician -Make any legal decisions or sign important papers.  Meals: Start with liquid foods such as  gelatin or soup. Progress to regular foods as tolerated. Avoid greasy, spicy, heavy foods. If nausea and/or vomiting occur, drink only clear liquids until the nausea and/or vomiting subsides. Call your physician if vomiting continues.  Special Instructions/Symptoms: Your throat may feel dry or sore from the anesthesia or the breathing tube placed in your throat during surgery. If this causes discomfort, gargle with warm salt water. The discomfort should disappear within 24 hours.  If you had a scopolamine patch placed behind your ear for the management of post- operative nausea and/or vomiting:  1. The medication in the patch is effective for 72 hours, after which it should be removed.  Wrap patch in a tissue and discard in the trash. Wash hands thoroughly with soap and water. 2. You may remove the patch earlier than 72 hours if you experience unpleasant side effects which may include dry mouth, dizziness or visual disturbances. 3. Avoid touching the patch. Wash your hands with soap and water after contact with the patch.    Post Anesthesia Home Care Instructions  Activity: Get plenty of rest for the remainder of the day. A responsible individual must stay with you for 24 hours following the procedure.  For the next 24 hours, DO NOT: -Drive a car -Advertising copywriter -Drink alcoholic beverages -Take any medication unless instructed by your physician -Make any legal decisions or sign important papers.  Meals: Start with liquid foods such as gelatin or soup. Progress to regular foods as tolerated. Avoid greasy, spicy, heavy foods. If nausea and/or vomiting occur, drink only clear liquids until the nausea and/or vomiting subsides. Call your physician if vomiting continues.  Special Instructions/Symptoms: Your throat may feel dry or sore from the anesthesia or the breathing tube placed in your throat during surgery. If this causes discomfort, gargle with warm salt water. The discomfort should  disappear within 24 hours.  If you had a scopolamine patch placed behind your ear for the management of post- operative nausea and/or vomiting:  1. The medication in the patch is effective for 72 hours, after which it should be removed.  Wrap patch in a tissue and discard in the trash. Wash hands thoroughly with soap and water. 2. You may remove the patch earlier than 72 hours if you experience unpleasant side effects which may include dry mouth, dizziness or visual disturbances. 3. Avoid touching the patch. Wash your hands with soap and water after contact with the patch.    Regional Anesthesia Blocks  1. Numbness or the inability to move the "blocked" extremity may last from 3-48 hours after placement. The length of time depends on the medication injected and your individual response to the medication. If the numbness is not going away after 48 hours, call your surgeon.  2. The extremity that is blocked will need to be protected until the numbness is gone and the  Strength has returned. Because you cannot feel it, you will need to take extra care to avoid injury. Because it may be weak, you may have difficulty moving it or using it. You may not know what position it is in without looking at it while the block is in  effect.  3. For blocks in the legs and feet, returning to weight bearing and walking needs to be done carefully. You will need to wait until the numbness is entirely gone and the strength has returned. You should be able to move your leg and foot normally before you try and bear weight or walk. You will need someone to be with you when you first try to ensure you do not fall and possibly risk injury.  4. Bruising and tenderness at the needle site are common side effects and will resolve in a few days.  5. Persistent numbness or new problems with movement should be communicated to the surgeon or the [facility] [phone] [facility] [phone]).Regional  Anesthesia Blocks  1. Numbness or the inability to move the "blocked" extremity may last from 3-48 hours after placement. The length of time depends on the medication injected and your individual response to the medication. If the numbness is not going away after 48 hours, call your surgeon.  2. The extremity that is blocked will need to be protected until the numbness is gone and the  Strength has returned. Because you cannot feel it, you will need to take extra care to avoid injury. Because it may be weak, you may have difficulty moving it or using it. You may not know what position it is in without looking at it while the block is in effect.  3. For blocks in the legs and feet, returning to weight bearing and walking needs to be done carefully. You will need to wait until the numbness is entirely gone and the strength has returned. You should be able to move your leg and foot normally before you try and bear weight or walk. You will need someone to be with you when you first try to ensure you do not fall and possibly risk injury.  4. Bruising and tenderness at the needle site are common side effects and will resolve in a few days.  5. Persistent numbness or new problems with movement should be communicated to the surgeon or the [facility] [phone] [facility] [phone]).

## 2023-05-29 NOTE — Progress Notes (Signed)
Assisted Dr. Houser with right, adductor canal, ultrasound guided block. Side rails up, monitors on throughout procedure. See vital signs in flow sheet. Tolerated Procedure well. 

## 2023-05-29 NOTE — Anesthesia Postprocedure Evaluation (Signed)
Anesthesia Post Note  Patient: Nathaniel Johnson  Procedure(s) Performed: RIGHT KNEE ARTHROSCOPY WITH ANTERIOR CRUCIATE LIGAMENT (ACL) RECONSTRUCTION WITH QUADRICEPS AUTOGRAFT (Right: Knee) KNEE ARTHROSCOPY WITH MENISCAL REPAIR LATERAL (Right: Knee)     Patient location during evaluation: PACU Anesthesia Type: Regional and General Level of consciousness: awake and alert Pain management: pain level controlled Vital Signs Assessment: post-procedure vital signs reviewed and stable Respiratory status: spontaneous breathing, nonlabored ventilation, respiratory function stable and patient connected to nasal cannula oxygen Cardiovascular status: blood pressure returned to baseline and stable Postop Assessment: no apparent nausea or vomiting Anesthetic complications: no   No notable events documented.  Last Vitals:  Vitals:   05/29/23 1500 05/29/23 1526  BP:  (!) 158/82  Pulse: (!) 50 (!) 59  Resp:  16  Temp:  36.4 C  SpO2: 100% 100%    Last Pain:  Vitals:   05/29/23 1526  PainSc: 0-No pain                 Trevor Iha

## 2023-05-29 NOTE — Anesthesia Procedure Notes (Signed)
Procedure Name: LMA Insertion Date/Time: 05/29/2023 11:52 AM  Performed by: Demetrio Lapping, CRNAPre-anesthesia Checklist: Patient identified, Emergency Drugs available, Suction available and Patient being monitored Patient Re-evaluated:Patient Re-evaluated prior to induction Oxygen Delivery Method: Circle System Utilized Preoxygenation: Pre-oxygenation with 100% oxygen Induction Type: IV induction Ventilation: Mask ventilation without difficulty LMA: LMA inserted LMA Size: 5.0 Number of attempts: 1 Airway Equipment and Method: Bite block Placement Confirmation: positive ETCO2 Tube secured with: Tape Dental Injury: Teeth and Oropharynx as per pre-operative assessment

## 2023-05-29 NOTE — Progress Notes (Signed)
Orthopedic Tech Progress Note Patient Details:  [name] June 23, 2006 [id number] RN at day surgery called requesting a BLEDSOE BRACE for patient. Called in order stat to [facility]    Patient ID: Nathaniel Johnson, male   DOB: Apr 12, 2006, 17 y.o.   MRN: [id number]  Donald Pore 05/29/2023, 12:48 PM

## 2023-05-29 NOTE — Op Note (Signed)
Date of Surgery: 05/29/2023  INDICATIONS: [name] is a 17 y.o.-year-old male with complete ACL tear and lateral meniscal tear.  The risk and benefits of the procedure were discussed in detail and documented in the pre-operative evaluation.   PREOPERATIVE DIAGNOSIS: 1. Right knee complete anterior cruciate ligament tear 2.  Right knee lateral meniscal tear  POSTOPERATIVE DIAGNOSIS: Same.  PROCEDURE: 1.  Right knee anterior cruciate ligament reconstruction with quadriceps tendon autograft. 2.  Right knee lateral meniscal repair  SURGEON: Benancio Deeds MD  ASSISTANT: Kerby Less, ATC  ANESTHESIA:  general plus adductor canal block  IV FLUIDS AND URINE: See anesthesia record.  ANTIBIOTICS: Ancef  ESTIMATED BLOOD LOSS: 50 mL.  IMPLANTS:  Implant Name Type Inv. Item Serial No. Manufacturer Lot No. LRB No. Used Action  IMPLANT FIBERSTITCH 1.5 CVD - [id number] Anchor IMPLANT FIBERSTITCH 1.5 CVD  [id number] Right 1 Implanted    DRAINS: None  CULTURES: None  COMPLICATIONS: none  DESCRIPTION OF PROCEDURE:  Examination under anesthesia: A careful examination under anesthesia was performed.  Knee ROM motion was: -3  - 130 Lachman: Grade 3B Pivot Shift: Positive Posterior drawer: normal Varus stability in full extension: normal Varus stability in 30 degrees of flexion: normal Valgus stability in full extension: normal Valgus stability in 30 degrees of flexion: normal Posterolateral drawer: normal   Intra-operative findings: A thorough arthroscopic examination of the knee was performed.  The findings are: 1. Suprapatellar pouch: Normal 2. Undersurface of median ridge: Normal 3. Medial patellar facet: Normal 4. Lateral patellar facet: Normal 5. Trochlea: Normal 6. Lateral gutter/popliteus tendon: Normal 7. Hoffa's fat pad: Normal 8. Medial gutter/plica: Normal 9. ACL: Complete tear 10. PCL: Normal 11. Medial meniscus: Normal 12. Medial compartment cartilage:  Normal 13. Lateral meniscus: Peripheral tear involving the posterior third but not involving the root 14. Lateral compartment cartilage: Normal      I identified the patient in the pre-operative holding area.  I marked the operative knee with my initials. I reviewed the risks and benefits of the proposed surgical intervention and the patient wished to proceed. Anesthesia was then performed with regional block.  The patient was transferred to the operative suite and placed in the supine position with all bony prominences padded.     SCDs were placed on the non-operative lower extremity. Appropriate antibiotics was administered within 1 hour before incision. The operative extremity was then prepped and draped in standard fashion. A time out was performed confirming the correct extremity, correct patient and correct procedure. A tourniquet was placed but not inflated.  Final timeout was performed. Arthroscopy portals were marked and portals were established with an 11 blade.  A diagnostic arthroscopy was performed with findings above. The femoral and tibial ACL footprints were debrided of tissue and prepared for tunnel placement.     There was a tear of the lateral meniscus involving the posterior third peripherally but not into the root   This was probed and given the fact that this came forward it was deemed unstable.  At this time and all inside fiber stitch implant was used in a mattress type fashion into the tear in order to approximate this.  This was reprobed without any evidence of instability.   The quad tendon graft harvest was done next.  A 5-cm vertical incision was made over the quadriceps insertion. Care was taken not to extend proximally greater than 8cm to avoid disruption of muscle belly. The paratenon was resected. The Arthrex double blade  9 mm quad tendon graft cutter was used to cut a strip of quadriceps tendon. A traction was held with an alice clamp. A quadriceps tendon  stripper/cutter was used to amputate the proximal graft at 65 mm with care not to violate the VMO muscle.    The free quadriceps tendon graft was placed on the Graft Station. The Graft Prep Attachments were placed on the base and the SpeedWhip Rip-Stop technique was employed to secure the suture/tissue interface of the TightRopes with a FiberTag scaffold. A TightRope was used on the quadriceps graft for femoral fixation and a TightRope ABS for tibial fixation.   The graft measured 10.5 mm on the femoral side and 11 mm on tibial side.  At this time the retrograde flip cutter guide was introduced into the lateral femoral condyle at the center of the anatomic femoral ACL footprint.  This was drilled for socket depth of 20 mm at a width of 10.5 mm.  A fiber stick was passed and a passing suture was passed through the tunnel.  Arthroscopy confirmed intact backwall.   The tibial tunnel was created with an 11 mm barrel reamer drill at 55 degree obliquity in the coronal plane, centered at the ACL footprint as determined by the anterior horn of the lateral meniscus through an incision in the anteromedial tibia.  Care was taken to again not converge with our previous tunnel from the lateral meniscal root repair.  The graft was then pulled into the knee with a traction suture. The ACL TightRope button was confirmed to be deep to the IT band using fluoroscopy.  The graft was then pulled into place with the white suture until parked in the femoral tunnel.     The graft was then affixed distally under tension with the knee in extension. The TightRope ABS Button was loaded onto the loop. The white shortening strands were pulled in alternating fashion to advance the button to bone and tension the graft. The knee was then cycled and further tightened on the femoral and tibial sides. The graft was probed and found to be taut at all angles of flexion.  Lachman examination improved to 1A.  The proximal fixation/TightRope was  tightened one last time to ensure maximal tightness and then excess suture removed with a knot cutter. The TightRope tails were fixed to the anterior tibia with a 4.75 mm SwiveLock distal to the ABS button for secondary fixation.   The quadriceps tendon harvest incision was closed with 0- Vicryl in the quad tendon, and and interrupted inverted 2-0 Vicryl in subcutaneous tissue and a 3-0 nylon in the skin.  The other incisions were closed superficially with 2-0 vicryl and 3-0 nylon.Xeroform, gauze, webril, and Ace wrap, Iceman, and brace locked at 0 degrees were applied to the knee.     Instrument, sponge, and needle counts were correct prior to wound closure and at the conclusion of the case.   The patient awoke from anesthesia without difficulty and was transferred to the PACU in stable condition.      POSTOPERATIVE PLAN: He will be weightbearing as tolerated on the right leg with the brace locked in extension.  He begin physical therapy immediately postop.  He will be seen in 2 weeks postop for suture removal.  He will be placed on aspirin for blood clot prevention  Benancio Deeds, MD 1:41 PM

## 2023-05-30 ENCOUNTER — Encounter (HOSPITAL_BASED_OUTPATIENT_CLINIC_OR_DEPARTMENT_OTHER): Payer: Self-pay | Admitting: Orthopaedic Surgery

## 2023-05-31 NOTE — Therapy (Signed)
OUTPATIENT PHYSICAL THERAPY EVALUATION   Patient Name: Nathaniel Johnson MRN: [id number] DOB:12-14-2005, 17 y.o., male Today's Date: 06/02/2023  END OF SESSION:  PT End of Session - 06/02/23 0859     Visit Number 1    Number of Visits 27    Date for PT Re-Evaluation 09/15/23    Authorization Type [employer]    PT Start Time [phone]    PT Stop Time 0920    PT Time Calculation (min) 22 min    Activity Tolerance Patient tolerated treatment well    Behavior During Therapy [facility] for tasks assessed/performed             Past Medical History:  Diagnosis Date   Gynecomastia 01/01/2017   Seasonal allergies    Torn ACL    Past Surgical History:  Procedure Laterality Date   KNEE ARTHROSCOPY WITH ANTERIOR CRUCIATE LIGAMENT (ACL) REPAIR WITH HAMSTRING GRAFT Right 05/29/2023   Procedure: RIGHT KNEE ARTHROSCOPY WITH ANTERIOR CRUCIATE LIGAMENT (ACL) RECONSTRUCTION WITH QUADRICEPS AUTOGRAFT;  Surgeon: [name], MD;  Location: [facility];  Service: Orthopedics;  Laterality: Right;   KNEE ARTHROSCOPY WITH MENISCAL REPAIR Right 05/29/2023   Procedure: KNEE ARTHROSCOPY WITH MENISCAL REPAIR LATERAL;  Surgeon: [name], MD;  Location: [facility];  Service: Orthopedics;  Laterality: Right;   NO PAST SURGERIES     Patient Active Problem List   Diagnosis Date Noted   Rupture of anterior cruciate ligament of right knee 05/29/2023   Peripheral tear of lateral meniscus of right knee as current injury 05/29/2023   Seasonal allergies 07/18/2016   Attention and concentration deficit 06/30/2015    REFERRING PROVIDER: Steward Drone, MD  REFERRING DIAG:  S83.511A (ICD-10-CM) - Rupture of anterior cruciate ligament of right knee, initial encounter    Post op 6/25  PROCEDURE: 1.  Right knee anterior cruciate ligament reconstruction with quadriceps tendon autograft. 2.  Right knee lateral meniscal repair  Rationale for Evaluation and Treatment:  Rehabilitation  THERAPY DIAG:  Acute pain of right knee  Muscle weakness (generalized)  Difficulty in walking, not elsewhere classified  ONSET DATE: DOS 05/29/23 SUBJECTIVE:                                                                                                                                                                                           SUBJECTIVE STATEMENT: Injured playing basketball. Same side knee dislocated about 2 years ago.   PERTINENT HISTORY:  none  PAIN:  Are you having pain? No  PRECAUTIONS:  Knee  WEIGHT BEARING RESTRICTIONS:  Yes POSTOPERATIVE PLAN: He will be weightbearing as tolerated on the  right leg with the brace locked in extension.  He begin physical therapy immediately postop.    FALLS:  Has patient fallen in last 6 months? No  OCCUPATION:  student  PLOF:  Independent  PATIENT GOALS:  Back to PLOF  OBJECTIVE:   PATIENT SURVEYS:  LEFS EVAL: 33  COGNITIVE STATUS: Within functional limits for tasks assessed   RED FLAGS: None    SENSATION: EVal: pt reports generalized numbness around knee  GAIT:  Assistive device utilized: none Level of assistance: Complete Independence Comments: knee brace locked in ext   Body Part #1 Knee  PALPATION: EVAL: edema without s/s of infection  LE MMT LOWER EXTREMITY MMT:    Eval: lacking full quad set availability    TREATMENT:                                                                                                                               DATE: EVAL 6/29 Changed bandages Fit brace See HEP    PATIENT EDUCATION:  Education details: Anatomy of condition, POC, HEP, exercise form/rationale Person educated: Patient and Parent Education method: Explanation, Demonstration, Tactile cues, Verbal cues, and Handouts Education comprehension: verbalized understanding, returned demonstration, verbal cues required, tactile cues required, and needs further  education  HOME EXERCISE PROGRAM: [id number]   ASSESSMENT:  CLINICAL IMPRESSION: Patient is a 17 y.o. M who was seen today for physical therapy evaluation and treatment for post op Rt ACLR.     REHAB POTENTIAL: Good  CLINICAL DECISION MAKING: Stable/uncomplicated  EVALUATION COMPLEXITY: Low   GOALS: Goals reviewed with patient? Yes  SHORT TERM GOALS: Target date: 7/27  Full avail knee ROM Baseline: 0 ext at eval, locked in ext Goal status: INITIAL  2.  10 SLR without quad lag, without brace Baseline: unable at eval Goal status: INITIAL  3.  Proper gait pattern with brace donned Baseline: unable at eval, locked in extension Goal status: INITIAL    LONG TERM GOALS: Target date: POC date  LEFS to improve to at least 66/80 Baseline: 33 at eval Goal status: INITIAL  2.  LE MMT 85% of opp LE via hand held dynamometry testing Baseline: will test as appropriate Goal status: INITIAL  3.  Proper form with plyometrics & running Baseline: unable at eval Goal status: INITIAL  4.  Able to navigate balance on unstable surfaces without pain or compensation Baseline: unable at eval Goal status: INITIAL  5.  Able to navigate stairs without pain or compensation Baseline: unable at eval Goal status: INITIAL     PLAN:  PT FREQUENCY: 1-2x/week  PT DURATION: POC date  PLANNED INTERVENTIONS: Therapeutic exercises, Therapeutic activity, Neuromuscular re-education, Balance training, Gait training, Patient/Family education, Self Care, Joint mobilization, Stair training, Aquatic Therapy, Dry Needling, Electrical stimulation, Spinal mobilization, Cryotherapy, Moist heat, scar mobilization, Taping, Vasopneumatic device, Manual therapy, and Re-evaluation.  PLAN FOR NEXT SESSION: per protocol   Salihah Peckham C. Namrata Dangler PT, DPT 06/02/23 12:28 PM  Check all possible CPT codes: 95621 - PT Re-evaluation, 97110- Therapeutic Exercise, [phone]- Neuro Re-education, [phone] - Gait  Training, [phone] - Manual Therapy, 97530 - Therapeutic Activities, [phone] - Self Care, [phone] - Electrical stimulation (unattended), 97016 - Vaso, [id number] - Physical performance training, and [phone] - Aquatic therapy    Check all conditions that are expected to impact treatment: {Conditions expected to impact treatment:None of these apply   If treatment provided at initial evaluation, no treatment charged due to lack of authorization.

## 2023-06-02 ENCOUNTER — Encounter (HOSPITAL_BASED_OUTPATIENT_CLINIC_OR_DEPARTMENT_OTHER): Payer: Self-pay | Admitting: Physical Therapy

## 2023-06-02 ENCOUNTER — Ambulatory Visit (HOSPITAL_BASED_OUTPATIENT_CLINIC_OR_DEPARTMENT_OTHER): Payer: BC Managed Care – PPO | Attending: Orthopaedic Surgery | Admitting: Physical Therapy

## 2023-06-02 ENCOUNTER — Other Ambulatory Visit: Payer: Self-pay

## 2023-06-02 DIAGNOSIS — R262 Difficulty in walking, not elsewhere classified: Secondary | ICD-10-CM | POA: Insufficient documentation

## 2023-06-02 DIAGNOSIS — S83511A Sprain of anterior cruciate ligament of right knee, initial encounter: Secondary | ICD-10-CM | POA: Diagnosis not present

## 2023-06-02 DIAGNOSIS — M6281 Muscle weakness (generalized): Secondary | ICD-10-CM | POA: Diagnosis not present

## 2023-06-02 DIAGNOSIS — M25561 Pain in right knee: Secondary | ICD-10-CM | POA: Insufficient documentation

## 2023-06-08 ENCOUNTER — Encounter (HOSPITAL_BASED_OUTPATIENT_CLINIC_OR_DEPARTMENT_OTHER): Payer: Medicaid Other | Admitting: Orthopaedic Surgery

## 2023-06-09 ENCOUNTER — Encounter (HOSPITAL_BASED_OUTPATIENT_CLINIC_OR_DEPARTMENT_OTHER): Payer: Medicaid Other | Admitting: Physical Therapy

## 2023-06-11 ENCOUNTER — Ambulatory Visit (INDEPENDENT_AMBULATORY_CARE_PROVIDER_SITE_OTHER): Payer: Medicaid Other | Admitting: Orthopaedic Surgery

## 2023-06-11 DIAGNOSIS — S83511A Sprain of anterior cruciate ligament of right knee, initial encounter: Secondary | ICD-10-CM

## 2023-06-11 NOTE — Progress Notes (Signed)
Post Operative Evaluation    Procedure/Date of Surgery: Status post right ACL reconstruction 6/25  Interval History:    Presents today 2-week status post above procedure.  Overall he is doing extremely well.  Denies any instability of the right knee.  He has mild swelling.  No pain in the right knee.   PMH/PSH/Family History/Social History/Meds/Allergies:    Past Medical History:  Diagnosis Date   Gynecomastia 01/01/2017   Seasonal allergies    Torn ACL    Past Surgical History:  Procedure Laterality Date   KNEE ARTHROSCOPY WITH ANTERIOR CRUCIATE LIGAMENT (ACL) REPAIR WITH HAMSTRING GRAFT Right 05/29/2023   Procedure: RIGHT KNEE ARTHROSCOPY WITH ANTERIOR CRUCIATE LIGAMENT (ACL) RECONSTRUCTION WITH QUADRICEPS AUTOGRAFT;  Surgeon: [name], MD;  Location: [facility];  Service: Orthopedics;  Laterality: Right;   KNEE ARTHROSCOPY WITH MENISCAL REPAIR Right 05/29/2023   Procedure: KNEE ARTHROSCOPY WITH MENISCAL REPAIR LATERAL;  Surgeon: [name], MD;  Location: [facility];  Service: Orthopedics;  Laterality: Right;   NO PAST SURGERIES     Social History   Socioeconomic History   Marital status: Single    Spouse name: Not on file   Number of children: Not on file   Years of education: Not on file   Highest education level: Not on file  Occupational History   Not on file  Tobacco Use   Smoking status: Never   Smokeless tobacco: Never  Vaping Use   Vaping Use: Never used  Substance and Sexual Activity   Alcohol use: No   Drug use: Yes    Types: Marijuana    Comment: occas   Sexual activity: Not on file  Other Topics Concern   Not on file  Social History Narrative   Not on file   Social Determinants of Health   Financial Resource Strain: Not on file  Food Insecurity: Not on file  Transportation Needs: Not on file  Physical Activity: Not on file  Stress: Not on file  Social Connections:  Not on file   Family History  Problem Relation Age of Onset   Hypertension Mother    Allergies  Allergen Reactions   Pollen Extract     hives   Current Outpatient Medications  Medication Sig Dispense Refill   aspirin EC 325 MG tablet Take 1 tablet (325 mg total) by mouth daily. 30 tablet 0   levocetirizine (XYZAL) 5 MG tablet Take 5 mg by mouth every evening.     oxyCODONE (ROXICODONE) 5 MG immediate release tablet Take 1 tablet (5 mg total) by mouth every 4 (four) hours as needed for severe pain or breakthrough pain. 10 tablet 0   No current facility-administered medications for this visit.   No results found.  Review of Systems:   A ROS was performed including pertinent positives and negatives as documented in the HPI.   Musculoskeletal Exam:    There were no vitals taken for this visit.  Right knee incisions are well-appearing without erythema or drainage.  Range of motion is from 0 to 90 degrees without pain.  Quadriceps atrophy and decreased bulk.  Mild effusion.  Negative Lachman  Imaging:      I personally reviewed and interpreted the radiographs.   Assessment:   2 weeks status post right knee ACL reconstruction with quadriceps  tendon autograft.  At this time he will continue to advance according to ACL protocol.  All incisions are restrictions were discussed.  Plan :    -Return to clinic 4 weeks for reassessment      I personally saw and evaluated the patient, and participated in the management and treatment plan.  [name], MD Attending Physician, Orthopedic Surgery  This document was dictated using Dragon voice recognition software. A reasonable attempt at proof reading has been made to minimize errors.

## 2023-06-15 ENCOUNTER — Ambulatory Visit (HOSPITAL_BASED_OUTPATIENT_CLINIC_OR_DEPARTMENT_OTHER): Payer: BC Managed Care – PPO | Attending: Orthopaedic Surgery | Admitting: Physical Therapy

## 2023-06-15 ENCOUNTER — Encounter (HOSPITAL_BASED_OUTPATIENT_CLINIC_OR_DEPARTMENT_OTHER): Payer: Self-pay | Admitting: Physical Therapy

## 2023-06-15 DIAGNOSIS — M25561 Pain in right knee: Secondary | ICD-10-CM | POA: Insufficient documentation

## 2023-06-15 DIAGNOSIS — R262 Difficulty in walking, not elsewhere classified: Secondary | ICD-10-CM | POA: Insufficient documentation

## 2023-06-15 DIAGNOSIS — M6281 Muscle weakness (generalized): Secondary | ICD-10-CM | POA: Insufficient documentation

## 2023-06-22 ENCOUNTER — Ambulatory Visit (HOSPITAL_BASED_OUTPATIENT_CLINIC_OR_DEPARTMENT_OTHER): Payer: BC Managed Care – PPO | Admitting: Physical Therapy

## 2023-06-22 ENCOUNTER — Encounter (HOSPITAL_BASED_OUTPATIENT_CLINIC_OR_DEPARTMENT_OTHER): Payer: Self-pay | Admitting: Physical Therapy

## 2023-06-22 DIAGNOSIS — R262 Difficulty in walking, not elsewhere classified: Secondary | ICD-10-CM

## 2023-06-22 DIAGNOSIS — M6281 Muscle weakness (generalized): Secondary | ICD-10-CM | POA: Diagnosis not present

## 2023-06-22 DIAGNOSIS — M25561 Pain in right knee: Secondary | ICD-10-CM

## 2023-06-22 NOTE — Therapy (Signed)
OUTPATIENT PHYSICAL THERAPY TREATMENT   Patient Name: Nathaniel Johnson MRN: [id number] DOB:07/23/2006, 17 y.o., male Today's Date: 06/22/2023  END OF SESSION:  PT End of Session - 06/22/23 0932     Visit Number 2    Number of Visits 27    Date for PT Re-Evaluation 09/15/23    Authorization Type [employer]    PT Start Time 0932    PT Stop Time 1010    PT Time Calculation (min) 38 min    Activity Tolerance Patient tolerated treatment well    Behavior During Therapy [facility] for tasks assessed/performed             Past Medical History:  Diagnosis Date   Gynecomastia 01/01/2017   Seasonal allergies    Torn ACL    Past Surgical History:  Procedure Laterality Date   KNEE ARTHROSCOPY WITH ANTERIOR CRUCIATE LIGAMENT (ACL) REPAIR WITH HAMSTRING GRAFT Right 05/29/2023   Procedure: RIGHT KNEE ARTHROSCOPY WITH ANTERIOR CRUCIATE LIGAMENT (ACL) RECONSTRUCTION WITH QUADRICEPS AUTOGRAFT;  Surgeon: [name], MD;  Location: [facility];  Service: Orthopedics;  Laterality: Right;   KNEE ARTHROSCOPY WITH MENISCAL REPAIR Right 05/29/2023   Procedure: KNEE ARTHROSCOPY WITH MENISCAL REPAIR LATERAL;  Surgeon: [name], MD;  Location: [facility];  Service: Orthopedics;  Laterality: Right;   NO PAST SURGERIES     Patient Active Problem List   Diagnosis Date Noted   Rupture of anterior cruciate ligament of right knee 05/29/2023   Peripheral tear of lateral meniscus of right knee as current injury 05/29/2023   Seasonal allergies 07/18/2016   Attention and concentration deficit 06/30/2015    REFERRING PROVIDER: Steward Drone, MD  REFERRING DIAG:  S83.511A (ICD-10-CM) - Rupture of anterior cruciate ligament of right knee, initial encounter    Post op 6/25  PROCEDURE: 1.  Right knee anterior cruciate ligament reconstruction with quadriceps tendon autograft. 2.  Right knee lateral meniscal repair  Rationale for Evaluation and Treatment:  Rehabilitation  THERAPY DIAG:  Acute pain of right knee  Muscle weakness (generalized)  Difficulty in walking, not elsewhere classified  ONSET DATE: DOS 05/29/23  Days since surgery: 24  SUBJECTIVE:                                                                                                                                                                                           SUBJECTIVE STATEMENT: Pt states that the knee is weak without the brace. He has had no pain at all. Saw MD and stitches came out. He has been trying to move the knee. He is trying to walk more about a  1 mile a day with brace on.   PERTINENT HISTORY:  none  PAIN:  Are you having pain? No  PRECAUTIONS:  Knee  WEIGHT BEARING RESTRICTIONS:  Yes POSTOPERATIVE PLAN: He will be weightbearing as tolerated on the right leg with the brace locked in extension.  He begin physical therapy immediately postop.    FALLS:  Has patient fallen in last 6 months? No  OCCUPATION:  student  PLOF:  Independent  PATIENT GOALS:  Back to PLOF  OBJECTIVE:   PATIENT SURVEYS:  LEFS EVAL: 33   TREATMENT:           7/19  Assisted knee flexion 2x10 5s hold  SLR 3x10 Partial LAQ 90-60 3x10 Tailgate knee flexion stretch 5s 10x TKE RTB 3x10      Half squats to high table focus on equal WB 3x8  Gait with brace unlocked                                                                                                                   DATE: EVAL 6/29 Changed bandages Fit brace See HEP    PATIENT EDUCATION:  Education details: Anatomy of condition, POC, HEP, exercise form/rationale Person educated: Patient and Parent Education method: Explanation, Demonstration, Tactile cues, Verbal cues, and Handouts Education comprehension: verbalized understanding, returned demonstration, verbal cues required, tactile cues required, and needs further education  HOME EXERCISE  PROGRAM: [id number]   ASSESSMENT:  CLINICAL IMPRESSION: Pt able to progress knee flexion ROM today to 85 deg, with full ext to 0. Pt able to demo 10 SLR with mild extensor lag once fatigue sets in. Brace unlocked to allow for progression of flexion. No pain noted during session and no signs of buckling. HEP updated. Plan to proceed per protocol with focus on flexion and quad strength. Pt would benefit from continued skilled therapy in order to reach goals and maximize functional R LE  strength and ROM for full return to PLOF.     REHAB POTENTIAL: Good  CLINICAL DECISION MAKING: Stable/uncomplicated  EVALUATION COMPLEXITY: Low   GOALS: Goals reviewed with patient? Yes  SHORT TERM GOALS: Target date: 7/27  Full avail knee ROM Baseline: 0 ext at eval, locked in ext Goal status: INITIAL  2.  10 SLR without quad lag, without brace Baseline: unable at eval Goal status: INITIAL  3.  Proper gait pattern with brace donned Baseline: unable at eval, locked in extension Goal status: INITIAL    LONG TERM GOALS: Target date: POC date  LEFS to improve to at least 66/80 Baseline: 33 at eval Goal status: INITIAL  2.  LE MMT 85% of opp LE via hand held dynamometry testing Baseline: will test as appropriate Goal status: INITIAL  3.  Proper form with plyometrics & running Baseline: unable at eval Goal status: INITIAL  4.  Able to navigate balance on unstable surfaces without pain or compensation Baseline: unable at eval Goal status: INITIAL  5.  Able to navigate stairs without pain or compensation Baseline: unable  at eval Goal status: INITIAL     PLAN:  PT FREQUENCY: 1-2x/week  PT DURATION: POC date  PLANNED INTERVENTIONS: Therapeutic exercises, Therapeutic activity, Neuromuscular re-education, Balance training, Gait training, Patient/Family education, Self Care, Joint mobilization, Stair training, Aquatic Therapy, Dry Needling, Electrical stimulation, Spinal  mobilization, Cryotherapy, Moist heat, scar mobilization, Taping, Vasopneumatic device, Manual therapy, and Re-evaluation.  PLAN FOR NEXT SESSION: per protocol  Zebedee Iba PT, DPT 06/22/23 10:37 AM   Check all possible CPT codes: 08657 - PT Re-evaluation, 97110- Therapeutic Exercise, [phone]- Neuro Re-education, [phone] - Gait Training, [phone] - Manual Therapy, 97530 - Therapeutic Activities, [phone] - Self Care, [phone] - Electrical stimulation (unattended), 97016 - Vaso, [id number] - Physical performance training, and [phone] - Aquatic therapy    Check all conditions that are expected to impact treatment: {Conditions expected to impact treatment:None of these apply   If treatment provided at initial evaluation, no treatment charged due to lack of authorization.

## 2023-06-26 ENCOUNTER — Ambulatory Visit (HOSPITAL_BASED_OUTPATIENT_CLINIC_OR_DEPARTMENT_OTHER): Payer: BC Managed Care – PPO

## 2023-06-26 ENCOUNTER — Encounter (HOSPITAL_BASED_OUTPATIENT_CLINIC_OR_DEPARTMENT_OTHER): Payer: Self-pay

## 2023-06-26 DIAGNOSIS — M6281 Muscle weakness (generalized): Secondary | ICD-10-CM

## 2023-06-26 DIAGNOSIS — R262 Difficulty in walking, not elsewhere classified: Secondary | ICD-10-CM

## 2023-06-26 DIAGNOSIS — M25561 Pain in right knee: Secondary | ICD-10-CM | POA: Diagnosis not present

## 2023-06-26 NOTE — Therapy (Signed)
OUTPATIENT PHYSICAL THERAPY TREATMENT   Patient Name: Nathaniel Johnson MRN: [id number] DOB:10/08/2006, 17 y.o., male Today's Date: 06/26/2023  END OF SESSION:  PT End of Session - 06/26/23 1014     Visit Number 3    Number of Visits 27    Date for PT Re-Evaluation 09/15/23    Authorization Type [employer]    PT Start Time 1007    PT Stop Time 1048    PT Time Calculation (min) 41 min    Activity Tolerance Patient tolerated treatment well    Behavior During Therapy [facility] for tasks assessed/performed              Past Medical History:  Diagnosis Date   Gynecomastia 01/01/2017   Seasonal allergies    Torn ACL    Past Surgical History:  Procedure Laterality Date   KNEE ARTHROSCOPY WITH ANTERIOR CRUCIATE LIGAMENT (ACL) REPAIR WITH HAMSTRING GRAFT Right 05/29/2023   Procedure: RIGHT KNEE ARTHROSCOPY WITH ANTERIOR CRUCIATE LIGAMENT (ACL) RECONSTRUCTION WITH QUADRICEPS AUTOGRAFT;  Surgeon: [name], MD;  Location: [facility];  Service: Orthopedics;  Laterality: Right;   KNEE ARTHROSCOPY WITH MENISCAL REPAIR Right 05/29/2023   Procedure: KNEE ARTHROSCOPY WITH MENISCAL REPAIR LATERAL;  Surgeon: [name], MD;  Location: [facility];  Service: Orthopedics;  Laterality: Right;   NO PAST SURGERIES     Patient Active Problem List   Diagnosis Date Noted   Rupture of anterior cruciate ligament of right knee 05/29/2023   Peripheral tear of lateral meniscus of right knee as current injury 05/29/2023   Seasonal allergies 07/18/2016   Attention and concentration deficit 06/30/2015    REFERRING PROVIDER: Steward Drone, MD  REFERRING DIAG:  S83.511A (ICD-10-CM) - Rupture of anterior cruciate ligament of right knee, initial encounter    Post op 6/25  PROCEDURE: 1.  Right knee anterior cruciate ligament reconstruction with quadriceps tendon autograft. 2.  Right knee lateral meniscal repair  Rationale for Evaluation and Treatment:  Rehabilitation  THERAPY DIAG:  Acute pain of right knee  Difficulty in walking, not elsewhere classified  Muscle weakness (generalized)  ONSET DATE: DOS 05/29/23  Days since surgery: 28  SUBJECTIVE:                                                                                                                                                                                           SUBJECTIVE STATEMENT: Pt reports compliance with HEP. Has been walking up to a mile with brace on. Reports continued swelling and tightness in knee, but no significant pain. Does bother him at night time.   PERTINENT HISTORY:  none  PAIN:  Are you having pain? No  PRECAUTIONS:  Knee  WEIGHT BEARING RESTRICTIONS:  Yes POSTOPERATIVE PLAN: He will be weightbearing as tolerated on the right leg with the brace locked in extension.  He begin physical therapy immediately postop.    FALLS:  Has patient fallen in last 6 months? No  OCCUPATION:  student  PLOF:  Independent  PATIENT GOALS:  Back to PLOF  OBJECTIVE:   PATIENT SURVEYS:  LEFS EVAL: 33   TREATMENT:          7/23  Assisted knee flexion 2x10 5s hold  SLR 3 way on plinth 3x10 Prone TKE 5" 2x10 Partial LAQ 90-60 3x10 Tailgate knee flexion stretch 5s 10x      Half squats tat rail 2x10 Standing HR/TR 3x10 Standing march 2x10 Fwd and backward walking full hall x1ea                                                 7/19  Assisted knee flexion 2x10 5s hold  SLR 3x10 Partial LAQ 90-60 3x10 Tailgate knee flexion stretch 5s 10x TKE RTB 3x10      Half squats to high table focus on equal WB 3x8  Gait with brace unlocked                                                                                                                   DATE: EVAL 6/29 Changed bandages Fit brace See HEP    PATIENT EDUCATION:  Education details: Anatomy of condition, POC, HEP, exercise form/rationale Person educated: Patient and  Parent Education method: Explanation, Demonstration, Tactile cues, Verbal cues, and Handouts Education comprehension: verbalized understanding, returned demonstration, verbal cues required, tactile cues required, and needs further education  HOME EXERCISE PROGRAM: [id number]   ASSESSMENT:  CLINICAL IMPRESSION:  Pt is now 4 weeks s/p. Has mild extensor deficit which improved following quad sets. Remains tight into end range knee flexion. Continued to work on hip/quad strength as well as knee ROM. Reviewed precautions and restrictions with pt with good understanding. Advised pt to use ice and elevate LE to aid with swelling. Will continue to progress as tolerated with protocol.       REHAB POTENTIAL: Good  CLINICAL DECISION MAKING: Stable/uncomplicated  EVALUATION COMPLEXITY: Low   GOALS: Goals reviewed with patient? Yes  SHORT TERM GOALS: Target date: 7/27  Full avail knee ROM Baseline: 0 ext at eval, locked in ext Goal status: INITIAL  2.  10 SLR without quad lag, without brace Baseline: unable at eval Goal status: INITIAL  3.  Proper gait pattern with brace donned Baseline: unable at eval, locked in extension Goal status: INITIAL    LONG TERM GOALS: Target date: POC date  LEFS to improve to at least 66/80 Baseline: 33 at eval Goal status: INITIAL  2.  LE MMT 85% of opp LE via  hand held dynamometry testing Baseline: will test as appropriate Goal status: INITIAL  3.  Proper form with plyometrics & running Baseline: unable at eval Goal status: INITIAL  4.  Able to navigate balance on unstable surfaces without pain or compensation Baseline: unable at eval Goal status: INITIAL  5.  Able to navigate stairs without pain or compensation Baseline: unable at eval Goal status: INITIAL     PLAN:  PT FREQUENCY: 1-2x/week  PT DURATION: POC date  PLANNED INTERVENTIONS: Therapeutic exercises, Therapeutic activity, Neuromuscular re-education, Balance training,  Gait training, Patient/Family education, Self Care, Joint mobilization, Stair training, Aquatic Therapy, Dry Needling, Electrical stimulation, Spinal mobilization, Cryotherapy, Moist heat, scar mobilization, Taping, Vasopneumatic device, Manual therapy, and Re-evaluation.  PLAN FOR NEXT SESSION: per protocol  Riki Altes, PTA  06/26/23 11:46 AM   Check all possible CPT codes: 16109 - PT Re-evaluation, 97110- Therapeutic Exercise, [phone]- Neuro Re-education, [phone] - Gait Training, [phone] - Manual Therapy, 97530 - Therapeutic Activities, [phone] - Self Care, [phone] - Electrical stimulation (unattended), 97016 - Vaso, [id number] - Physical performance training, and [phone] - Aquatic therapy    Check all conditions that are expected to impact treatment: {Conditions expected to impact treatment:None of these apply   If treatment provided at initial evaluation, no treatment charged due to lack of authorization.

## 2023-06-29 ENCOUNTER — Encounter (HOSPITAL_BASED_OUTPATIENT_CLINIC_OR_DEPARTMENT_OTHER): Payer: Self-pay | Admitting: Physical Therapy

## 2023-06-29 ENCOUNTER — Ambulatory Visit (HOSPITAL_BASED_OUTPATIENT_CLINIC_OR_DEPARTMENT_OTHER): Payer: BC Managed Care – PPO | Admitting: Physical Therapy

## 2023-06-29 DIAGNOSIS — M6281 Muscle weakness (generalized): Secondary | ICD-10-CM | POA: Diagnosis not present

## 2023-06-29 DIAGNOSIS — R262 Difficulty in walking, not elsewhere classified: Secondary | ICD-10-CM

## 2023-06-29 DIAGNOSIS — M25561 Pain in right knee: Secondary | ICD-10-CM | POA: Diagnosis not present

## 2023-06-29 NOTE — Therapy (Signed)
OUTPATIENT PHYSICAL THERAPY TREATMENT   Patient Name: Nathaniel Johnson MRN: [id number] DOB:[birth year], 17 y.o., male Today's Date: 06/29/2023  END OF SESSION:  PT End of Session - 06/29/23 0939     Visit Number 4    Number of Visits 27    Date for PT Re-Evaluation 09/15/23    Authorization Type [employer]    PT Start Time [phone]    PT Stop Time 1014    PT Time Calculation (min) 38 min    Activity Tolerance Patient tolerated treatment well    Behavior During Therapy [facility] for tasks assessed/performed               Past Medical History:  Diagnosis Date   Gynecomastia 01/01/2017   Seasonal allergies    Torn ACL    Past Surgical History:  Procedure Laterality Date   KNEE ARTHROSCOPY WITH ANTERIOR CRUCIATE LIGAMENT (ACL) REPAIR WITH HAMSTRING GRAFT Right 05/29/2023   Procedure: RIGHT KNEE ARTHROSCOPY WITH ANTERIOR CRUCIATE LIGAMENT (ACL) RECONSTRUCTION WITH QUADRICEPS AUTOGRAFT;  Surgeon: [name], MD;  Location: [facility];  Service: Orthopedics;  Laterality: Right;   KNEE ARTHROSCOPY WITH MENISCAL REPAIR Right 05/29/2023   Procedure: KNEE ARTHROSCOPY WITH MENISCAL REPAIR LATERAL;  Surgeon: [name], MD;  Location: [facility];  Service: Orthopedics;  Laterality: Right;   NO PAST SURGERIES     Patient Active Problem List   Diagnosis Date Noted   Rupture of anterior cruciate ligament of right knee 05/29/2023   Peripheral tear of lateral meniscus of right knee as current injury 05/29/2023   Seasonal allergies 07/18/2016   Attention and concentration deficit 06/30/2015    REFERRING PROVIDER: Steward Drone, MD  REFERRING DIAG:  S83.511A (ICD-10-CM) - Rupture of anterior cruciate ligament of right knee, initial encounter    Post op 6/25  PROCEDURE: 1.  Right knee anterior cruciate ligament reconstruction with quadriceps tendon autograft. 2.  Right knee lateral meniscal repair  Rationale for Evaluation and Treatment:  Rehabilitation  THERAPY DIAG:  Acute pain of right knee  Difficulty in walking, not elsewhere classified  Muscle weakness (generalized)  ONSET DATE: DOS 05/29/23  Days since surgery: 31  SUBJECTIVE:                                                                                                                                                                                           SUBJECTIVE STATEMENT: One buckling time.   PERTINENT HISTORY:  none  PAIN:  Are you having pain? No  PRECAUTIONS:  Knee  WEIGHT BEARING RESTRICTIONS:  Yes POSTOPERATIVE PLAN: He will be weightbearing as tolerated on the  right leg with the brace locked in extension.  He begin physical therapy immediately postop.    FALLS:  Has patient fallen in last 6 months? No  OCCUPATION:  student  PLOF:  Independent  PATIENT GOALS:  Back to PLOF  OBJECTIVE:   PATIENT SURVEYS:  LEFS EVAL: 33   TREATMENT:          Treatment                            7/26:  Passive HS & ITB stretching IASTM HS & gastroc Standing gastroc stretch Backward walking & side stepping in mini squat   7/23  Assisted knee flexion 2x10 5s hold  SLR 3 way on plinth 3x10 Prone TKE 5" 2x10 Partial LAQ 90-60 3x10 Tailgate knee flexion stretch 5s 10x      Half squats tat rail 2x10 Standing HR/TR 3x10 Standing march 2x10 Fwd and backward walking full hall x1ea                                                 7/19  Assisted knee flexion 2x10 5s hold  SLR 3x10 Partial LAQ 90-60 3x10 Tailgate knee flexion stretch 5s 10x TKE RTB 3x10      Half squats to high table focus on equal WB 3x8  Gait with brace unlocked    PATIENT EDUCATION:  Education details: Anatomy of condition, POC, HEP, exercise form/rationale Person educated: Patient and Parent Education method: Explanation, Demonstration, Tactile cues, Verbal cues, and Handouts Education comprehension: verbalized understanding, returned demonstration,  verbal cues required, tactile cues required, and needs further education  HOME EXERCISE PROGRAM: [email or web address]   ASSESSMENT:  CLINICAL IMPRESSION: Able to demo full knee extension and demonstrates good quad activation and endurance necessary for functional ambulation at this point.  Fatigue reported with signs and symptoms of buckling occasionally.  Significant tightness in hamstrings gastrocs and IT band resulting in a quick heel lift in stance phase of gait.  Progressed HEP for proximal strength as well as improving flexibility.  Will continue to wear hinged knee brace that is unlocked but did discuss possibility of requiring sleeve over knee to help control edema.   REHAB POTENTIAL: Good  CLINICAL DECISION MAKING: Stable/uncomplicated  EVALUATION COMPLEXITY: Low   GOALS: Goals reviewed with patient? Yes  SHORT TERM GOALS: Target date: 7/27  Full avail knee ROM Baseline: 0 ext at eval, locked in ext Goal status: achieved  2.  10 SLR without quad lag, without brace Baseline: unable at eval Goal status: achieved  3.  Proper gait pattern with brace donned Baseline: unable at eval, locked in extension Goal status: achieved    LONG TERM GOALS: Target date: POC date  LEFS to improve to at least 66/80 Baseline: 33 at eval Goal status: INITIAL  2.  LE MMT 85% of opp LE via hand held dynamometry testing Baseline: will test as appropriate Goal status: INITIAL  3.  Proper form with plyometrics & running Baseline: unable at eval Goal status: INITIAL  4.  Able to navigate balance on unstable surfaces without pain or compensation Baseline: unable at eval Goal status: INITIAL  5.  Able to navigate stairs without pain or compensation Baseline: unable at eval Goal status: INITIAL     PLAN:  PT FREQUENCY: 1-2x/week  PT DURATION: POC  date  PLANNED INTERVENTIONS: Therapeutic exercises, Therapeutic activity, Neuromuscular re-education, Balance training, Gait training,  Patient/Family education, Self Care, Joint mobilization, Stair training, Aquatic Therapy, Dry Needling, Electrical stimulation, Spinal mobilization, Cryotherapy, Moist heat, scar mobilization, Taping, Vasopneumatic device, Manual therapy, and Re-evaluation.  PLAN FOR NEXT SESSION: per protocol  Caelen Higinbotham C. Shivali Quackenbush PT, DPT 06/29/23 10:16 AM    Check all possible CPT codes: 30865 - PT Re-evaluation, 97110- Therapeutic Exercise, [phone]- Neuro Re-education, [phone] - Gait Training, [phone] - Manual Therapy, 97530 - Therapeutic Activities, [phone] - Self Care, [phone] - Electrical stimulation (unattended), [id number] - Vaso, [id number] - Physical performance training, and [phone] - Aquatic therapy    Check all conditions that are expected to impact treatment: {Conditions expected to impact treatment:None of these apply   If treatment provided at initial evaluation, no treatment charged due to lack of authorization.

## 2023-07-03 ENCOUNTER — Encounter (HOSPITAL_BASED_OUTPATIENT_CLINIC_OR_DEPARTMENT_OTHER): Payer: Self-pay | Admitting: Physical Therapy

## 2023-07-03 ENCOUNTER — Ambulatory Visit (HOSPITAL_BASED_OUTPATIENT_CLINIC_OR_DEPARTMENT_OTHER): Payer: BC Managed Care – PPO | Admitting: Physical Therapy

## 2023-07-03 DIAGNOSIS — M25561 Pain in right knee: Secondary | ICD-10-CM | POA: Diagnosis not present

## 2023-07-03 DIAGNOSIS — R262 Difficulty in walking, not elsewhere classified: Secondary | ICD-10-CM | POA: Diagnosis not present

## 2023-07-03 DIAGNOSIS — M6281 Muscle weakness (generalized): Secondary | ICD-10-CM

## 2023-07-03 NOTE — Therapy (Signed)
OUTPATIENT PHYSICAL THERAPY TREATMENT   Patient Name: Nathaniel Johnson MRN: [id number] DOB:[birth year], 17 y.o., male Today's Date: 07/03/2023  END OF SESSION:  PT End of Session - 07/03/23 1002     Visit Number 5    Number of Visits 27    Date for PT Re-Evaluation 09/15/23    Authorization Type [employer]    PT Start Time 1015    PT Stop Time 1056    PT Time Calculation (min) 41 min    Activity Tolerance Patient tolerated treatment well    Behavior During Therapy [facility] for tasks assessed/performed               Past Medical History:  Diagnosis Date   Gynecomastia 01/01/2017   Seasonal allergies    Torn ACL    Past Surgical History:  Procedure Laterality Date   KNEE ARTHROSCOPY WITH ANTERIOR CRUCIATE LIGAMENT (ACL) REPAIR WITH HAMSTRING GRAFT Right 05/29/2023   Procedure: RIGHT KNEE ARTHROSCOPY WITH ANTERIOR CRUCIATE LIGAMENT (ACL) RECONSTRUCTION WITH QUADRICEPS AUTOGRAFT;  Surgeon: [name], MD;  Location: [facility];  Service: Orthopedics;  Laterality: Right;   KNEE ARTHROSCOPY WITH MENISCAL REPAIR Right 05/29/2023   Procedure: KNEE ARTHROSCOPY WITH MENISCAL REPAIR LATERAL;  Surgeon: [name], MD;  [facility];  Service: Orthopedics;  Laterality: Right;   NO PAST SURGERIES     Patient Active Problem List   Diagnosis Date Noted   Rupture of anterior cruciate ligament of right knee 05/29/2023   Peripheral tear of lateral meniscus of right knee as current injury 05/29/2023   Seasonal allergies 07/18/2016   Attention and concentration deficit 06/30/2015    REFERRING PROVIDER: Steward Drone, MD  REFERRING DIAG:  S83.511A (ICD-10-CM) - Rupture of anterior cruciate ligament of right knee, initial encounter    Post op 6/25  PROCEDURE: 1.  Right knee anterior cruciate ligament reconstruction with quadriceps tendon autograft. 2.  Right knee lateral meniscal repair  Rationale for Evaluation and Treatment:  Rehabilitation  THERAPY DIAG:  Acute pain of right knee  Difficulty in walking, not elsewhere classified  Muscle weakness (generalized)  ONSET DATE: DOS 05/29/23  Days since surgery: 35  SUBJECTIVE:                                                                                                                                                                                           SUBJECTIVE STATEMENT: Pt states that sleeping with the brace on will irritate the lateral part of the knee. No more buckling.   PERTINENT HISTORY:  none  PAIN:  Are you having pain? No  PRECAUTIONS:  Knee  WEIGHT BEARING RESTRICTIONS:  Yes POSTOPERATIVE PLAN: He will be weightbearing as tolerated on the right leg with the brace locked in extension.  He begin physical therapy immediately postop.    FALLS:  Has patient fallen in last 6 months? No  OCCUPATION:  student  PLOF:  Independent  PATIENT GOALS:  Back to PLOF  OBJECTIVE:   PATIENT SURVEYS:  LEFS EVAL: 33   TREATMENT:          7/30  107 flexion,  0 ext   Recumbent bike half rev 6 min for motion  Grade III R knee ext mob with tibial ER  2lb SLR 2x10 5lb LAQ 90-45 3x8  Full plank push up 3x failure  Sidestep, monster walks with ball tosse 17ft 3x  Table box squat 3x10  SLS 30s 2x on foam, 2x without  Heel tap 6" box 3x8     Treatment                            7/26:  Passive HS & ITB stretching IASTM HS & gastroc Standing gastroc stretch Backward walking & side stepping in mini squat   7/23  Assisted knee flexion 2x10 5s hold  SLR 3 way on plinth 3x10 Prone TKE 5" 2x10 Partial LAQ 90-60 3x10 Tailgate knee flexion stretch 5s 10x      Half squats tat rail 2x10 Standing HR/TR 3x10 Standing march 2x10 Fwd and backward walking full hall x1ea                                                 7/19  Assisted knee flexion 2x10 5s hold  SLR 3x10 Partial LAQ 90-60 3x10 Tailgate knee flexion stretch 5s  10x TKE RTB 3x10      Half squats to high table focus on equal WB 3x8  Gait with brace unlocked    PATIENT EDUCATION:  Education details: Anatomy of condition, POC, HEP, exercise form/rationale Person educated: Patient and Parent Education method: Programmer, multimedia, Demonstration, Tactile cues, Verbal cues, and Handouts Education comprehension: verbalized understanding, returned demonstration, verbal cues required, tactile cues required, and needs further education  HOME EXERCISE PROGRAM: [email or web address]   ASSESSMENT:  CLINICAL IMPRESSION: Pt able to reach greater than 110 degrees of knee flexion and 0 deg of ext. Pt able to progress CKC exercise today and hip strength wihtout pain or discomfort. Pt demos good quad contraction with SL step down and SLS exercise. Pt likely able to progress to external perturbations in addition to leg press at next session. HEP progressed today with DOMS expectations discussed. Pt walks with stiff knee gait due to apprehension more than quad strength deficit. Plan to continue with progression of quad strength at future sessions with focus on gaining flexion while maintaining ext. Pt would benefit from continued skilled therapy in order to reach goals and maximize functional R LE  strength and ROM for full return to PLOF.    REHAB POTENTIAL: Good  CLINICAL DECISION MAKING: Stable/uncomplicated  EVALUATION COMPLEXITY: Low   GOALS: Goals reviewed with patient? Yes  SHORT TERM GOALS: Target date: 7/27  Full avail knee ROM Baseline: 0 ext at eval, locked in ext Goal status: achieved  2.  10 SLR without quad lag, without brace Baseline: unable at eval Goal status: achieved  3.  Proper gait  pattern with brace donned Baseline: unable at eval, locked in extension Goal status: achieved    LONG TERM GOALS: Target date: POC date  LEFS to improve to at least 66/80 Baseline: 33 at eval Goal status: INITIAL  2.  LE MMT 85% of opp LE via hand held  dynamometry testing Baseline: will test as appropriate Goal status: INITIAL  3.  Proper form with plyometrics & running Baseline: unable at eval Goal status: INITIAL  4.  Able to navigate balance on unstable surfaces without pain or compensation Baseline: unable at eval Goal status: INITIAL  5.  Able to navigate stairs without pain or compensation Baseline: unable at eval Goal status: INITIAL     PLAN:  PT FREQUENCY: 1-2x/week  PT DURATION: POC date  PLANNED INTERVENTIONS: Therapeutic exercises, Therapeutic activity, Neuromuscular re-education, Balance training, Gait training, Patient/Family education, Self Care, Joint mobilization, Stair training, Aquatic Therapy, Dry Needling, Electrical stimulation, Spinal mobilization, Cryotherapy, Moist heat, scar mobilization, Taping, Vasopneumatic device, Manual therapy, and Re-evaluation.  PLAN FOR NEXT SESSION: per protocol  Zebedee Iba PT, DPT 07/03/23 11:02 AM     Check all possible CPT codes: 60109 - PT Re-evaluation, 97110- Therapeutic Exercise, [phone]- Neuro Re-education, [phone] - Gait Training, [phone] - Manual Therapy, 97530 - Therapeutic Activities, [phone] - Self Care, [phone] - Electrical stimulation (unattended), 97016 - Vaso, [id number] - Physical performance training, and [phone] - Aquatic therapy    Check all conditions that are expected to impact treatment: {Conditions expected to impact treatment:None of these apply   If treatment provided at initial evaluation, no treatment charged due to lack of authorization.

## 2023-07-05 NOTE — Therapy (Signed)
OUTPATIENT PHYSICAL THERAPY TREATMENT   Patient Name: Nathaniel Johnson MRN: [id number] DOB:March 11, 2006, 17 y.o., male Today's Date: 07/06/2023  END OF SESSION:  PT End of Session - 07/06/23 0930     Visit Number 6    Number of Visits 27    Date for PT Re-Evaluation 09/15/23    Authorization Type [employer]    PT Start Time 0930    PT Stop Time 1011    PT Time Calculation (min) 41 min    Activity Tolerance Patient tolerated treatment well    Behavior During Therapy [facility] for tasks assessed/performed                Past Medical History:  Diagnosis Date   Gynecomastia 01/01/2017   Seasonal allergies    Torn ACL    Past Surgical History:  Procedure Laterality Date   KNEE ARTHROSCOPY WITH ANTERIOR CRUCIATE LIGAMENT (ACL) REPAIR WITH HAMSTRING GRAFT Right 05/29/2023   Procedure: RIGHT KNEE ARTHROSCOPY WITH ANTERIOR CRUCIATE LIGAMENT (ACL) RECONSTRUCTION WITH QUADRICEPS AUTOGRAFT;  Surgeon: [name], MD;  Location: [facility];  Service: Orthopedics;  Laterality: Right;   KNEE ARTHROSCOPY WITH MENISCAL REPAIR Right 05/29/2023   Procedure: KNEE ARTHROSCOPY WITH MENISCAL REPAIR LATERAL;  Surgeon: [name], MD;  Location: [facility];  Service: Orthopedics;  Laterality: Right;   NO PAST SURGERIES     Patient Active Problem List   Diagnosis Date Noted   Rupture of anterior cruciate ligament of right knee 05/29/2023   Peripheral tear of lateral meniscus of right knee as current injury 05/29/2023   Seasonal allergies 07/18/2016   Attention and concentration deficit 06/30/2015    REFERRING PROVIDER: Steward Drone, MD  REFERRING DIAG:  S83.511A (ICD-10-CM) - Rupture of anterior cruciate ligament of right knee, initial encounter    Post op 6/25  PROCEDURE: 1.  Right knee anterior cruciate ligament reconstruction with quadriceps tendon autograft. 2.  Right knee lateral meniscal repair  Rationale for Evaluation and Treatment:  Rehabilitation  THERAPY DIAG:  Acute pain of right knee  Difficulty in walking, not elsewhere classified  Muscle weakness (generalized)  ONSET DATE: DOS 05/29/23  Days since surgery: 38  SUBJECTIVE:                                                                                                                                                                                           SUBJECTIVE STATEMENT: Its really to the point where I don't have pain.   PERTINENT HISTORY:  none  PAIN:  Are you having pain? No  PRECAUTIONS:  Knee  WEIGHT BEARING RESTRICTIONS:  Yes POSTOPERATIVE PLAN:  He will be weightbearing as tolerated on the right leg with the brace locked in extension.  He begin physical therapy immediately postop.    FALLS:  Has patient fallen in last 6 months? No  OCCUPATION:  student  PLOF:  Independent  PATIENT GOALS:  Back to PLOF  OBJECTIVE:   PATIENT SURVEYS:  LEFS EVAL: 33   TREATMENT:          Treatment                            07/06/23:  Half rev on bike 5 min Lateral step down with quick up- cues for gluts SLS ABCs Standing SLR from elevated surface Supine hamstring stretch- midline & lateral Sidelying hip abduction: circles, hip flexion  7/30  107 flexion,  0 ext   Recumbent bike half rev 6 min for motion  Grade III R knee ext mob with tibial ER  2lb SLR 2x10 5lb LAQ 90-45 3x8  Full plank push up 3x failure  Sidestep, monster walks with ball tosse 42ft 3x  Table box squat 3x10  SLS 30s 2x on foam, 2x without  Heel tap 6" box 3x8     Treatment                            7/26:  Passive HS & ITB stretching IASTM HS & gastroc Standing gastroc stretch Backward walking & side stepping in mini squat   7/23  Assisted knee flexion 2x10 5s hold  SLR 3 way on plinth 3x10 Prone TKE 5" 2x10 Partial LAQ 90-60 3x10 Tailgate knee flexion stretch 5s 10x      Half squats tat rail 2x10 Standing HR/TR 3x10 Standing march  2x10 Fwd and backward walking full hall x1ea                                                  PATIENT EDUCATION:  Education details: Teacher, music of condition, POC, HEP, exercise form/rationale Person educated: Patient and Parent Education method: Explanation, Demonstration, Tactile cues, Verbal cues, and Handouts Education comprehension: verbalized understanding, returned demonstration, verbal cues required, tactile cues required, and needs further education  HOME EXERCISE PROGRAM: [email or web address]   ASSESSMENT:  CLINICAL IMPRESSION: Excellent control in standing exercises. Knee flexion is limited to about 100 deg by pinching sensation at beginning of session. Began in CKC exercises and finished in [facility] for muscular strength.     REHAB POTENTIAL: Good  CLINICAL DECISION MAKING: Stable/uncomplicated  EVALUATION COMPLEXITY: Low   GOALS: Goals reviewed with patient? Yes  SHORT TERM GOALS: Target date: 7/27  Full avail knee ROM Baseline: 0 ext at eval, locked in ext Goal status: achieved  2.  10 SLR without quad lag, without brace Baseline: unable at eval Goal status: achieved  3.  Proper gait pattern with brace donned Baseline: unable at eval, locked in extension Goal status: achieved    LONG TERM GOALS: Target date: POC date  LEFS to improve to at least 66/80 Baseline: 33 at eval Goal status: INITIAL  2.  LE MMT 85% of opp LE via hand held dynamometry testing Baseline: will test as appropriate Goal status: INITIAL  3.  Proper form with plyometrics & running Baseline: unable at eval Goal status: INITIAL  4.  Able to navigate balance on unstable surfaces without pain or compensation Baseline: unable at eval Goal status: INITIAL  5.  Able to navigate stairs without pain or compensation Baseline: unable at eval Goal status: INITIAL     PLAN:  PT FREQUENCY: 1-2x/week  PT DURATION: POC date  PLANNED INTERVENTIONS: Therapeutic exercises, Therapeutic activity,  Neuromuscular re-education, Balance training, Gait training, Patient/Family education, Self Care, Joint mobilization, Stair training, Aquatic Therapy, Dry Needling, Electrical stimulation, Spinal mobilization, Cryotherapy, Moist heat, scar mobilization, Taping, Vasopneumatic device, Manual therapy, and Re-evaluation.  PLAN FOR NEXT SESSION: per protocol  Broox Lonigro C. Quinlin Conant PT, DPT 07/06/23 10:12 AM      Check all possible CPT codes: 57846 - PT Re-evaluation, 97110- Therapeutic Exercise, [phone]- Neuro Re-education, [phone] - Gait Training, [phone] - Manual Therapy, 97530 - Therapeutic Activities, [phone] - Self Care, [phone] - Electrical stimulation (unattended), 97016 - Vaso, [id number] - Physical performance training, and [phone] - Aquatic therapy    Check all conditions that are expected to impact treatment: {Conditions expected to impact treatment:None of these apply   If treatment provided at initial evaluation, no treatment charged due to lack of authorization.

## 2023-07-06 ENCOUNTER — Encounter (HOSPITAL_BASED_OUTPATIENT_CLINIC_OR_DEPARTMENT_OTHER): Payer: Self-pay | Admitting: Physical Therapy

## 2023-07-06 ENCOUNTER — Ambulatory Visit (HOSPITAL_BASED_OUTPATIENT_CLINIC_OR_DEPARTMENT_OTHER): Payer: BC Managed Care – PPO | Attending: Orthopaedic Surgery | Admitting: Physical Therapy

## 2023-07-06 DIAGNOSIS — M6281 Muscle weakness (generalized): Secondary | ICD-10-CM | POA: Insufficient documentation

## 2023-07-06 DIAGNOSIS — M25561 Pain in right knee: Secondary | ICD-10-CM | POA: Diagnosis not present

## 2023-07-06 DIAGNOSIS — R262 Difficulty in walking, not elsewhere classified: Secondary | ICD-10-CM | POA: Insufficient documentation

## 2023-07-09 ENCOUNTER — Encounter (HOSPITAL_BASED_OUTPATIENT_CLINIC_OR_DEPARTMENT_OTHER): Payer: Self-pay

## 2023-07-09 ENCOUNTER — Ambulatory Visit (INDEPENDENT_AMBULATORY_CARE_PROVIDER_SITE_OTHER): Payer: BC Managed Care – PPO | Admitting: Orthopaedic Surgery

## 2023-07-09 ENCOUNTER — Ambulatory Visit (HOSPITAL_BASED_OUTPATIENT_CLINIC_OR_DEPARTMENT_OTHER): Payer: BC Managed Care – PPO

## 2023-07-09 DIAGNOSIS — S83511A Sprain of anterior cruciate ligament of right knee, initial encounter: Secondary | ICD-10-CM

## 2023-07-09 NOTE — Progress Notes (Signed)
Post Operative Evaluation    Procedure/Date of Surgery: Status post right ACL reconstruction 6/25  Interval History:    Presents today 6-week status post above procedure.  Overall he is doing extremely well.  He is continuing to work with Freida Busman with physical therapy which is going well.  Range of motion is improving.   PMH/PSH/Family History/Social History/Meds/Allergies:    Past Medical History:  Diagnosis Date   Gynecomastia 01/01/2017   Seasonal allergies    Torn ACL    Past Surgical History:  Procedure Laterality Date   KNEE ARTHROSCOPY WITH ANTERIOR CRUCIATE LIGAMENT (ACL) REPAIR WITH HAMSTRING GRAFT Right 05/29/2023   Procedure: RIGHT KNEE ARTHROSCOPY WITH ANTERIOR CRUCIATE LIGAMENT (ACL) RECONSTRUCTION WITH QUADRICEPS AUTOGRAFT;  Surgeon: [name], MD;  Location: [facility];  Service: Orthopedics;  Laterality: Right;   KNEE ARTHROSCOPY WITH MENISCAL REPAIR Right 05/29/2023   Procedure: KNEE ARTHROSCOPY WITH MENISCAL REPAIR LATERAL;  Surgeon: [name], MD;  Location: [facility];  Service: Orthopedics;  Laterality: Right;   NO PAST SURGERIES     Social History   Socioeconomic History   Marital status: Single    Spouse name: Not on file   Number of children: Not on file   Years of education: Not on file   Highest education level: Not on file  Occupational History   Not on file  Tobacco Use   Smoking status: Never   Smokeless tobacco: Never  Vaping Use   Vaping status: Never Used  Substance and Sexual Activity   Alcohol use: No   Drug use: Yes    Types: Marijuana    Comment: occas   Sexual activity: Not on file  Other Topics Concern   Not on file  Social History Narrative   Not on file   Social Determinants of Health   Financial Resource Strain: Not on file  Food Insecurity: Not on file  Transportation Needs: Not on file  Physical Activity: Not on file  Stress: Not on file   Social Connections: Not on file   Family History  Problem Relation Age of Onset   Hypertension Mother    Allergies  Allergen Reactions   Pollen Extract     hives   Current Outpatient Medications  Medication Sig Dispense Refill   aspirin EC 325 MG tablet Take 1 tablet (325 mg total) by mouth daily. 30 tablet 0   levocetirizine (XYZAL) 5 MG tablet Take 5 mg by mouth every evening.     oxyCODONE (ROXICODONE) 5 MG immediate release tablet Take 1 tablet (5 mg total) by mouth every 4 (four) hours as needed for severe pain or breakthrough pain. 10 tablet 0   No current facility-administered medications for this visit.   No results found.  Review of Systems:   A ROS was performed including pertinent positives and negatives as documented in the HPI.   Musculoskeletal Exam:    There were no vitals taken for this visit.  Right knee incisions are well-appearing without erythema or drainage.  Range of motion is from 0 to 115 degrees without pain.  Quadriceps atrophy and decreased bulk.  Mild effusion.  Negative Lachman  Imaging:      I personally reviewed and interpreted the radiographs.   Assessment:   6 weeks status post right knee ACL reconstruction with  quadriceps tendon autograft.  At this time he will continue to advance according to ACL protocol.  All incisions are restrictions were discussed.  I will plan to see him back in 6 weeks for reassessment  Plan :    -Return to clinic 6 weeks for reassessment      I personally saw and evaluated the patient, and participated in the management and treatment plan.  [name], MD Attending Physician, Orthopedic Surgery  This document was dictated using Dragon voice recognition software. A reasonable attempt at proof reading has been made to minimize errors.

## 2023-07-11 ENCOUNTER — Ambulatory Visit (HOSPITAL_BASED_OUTPATIENT_CLINIC_OR_DEPARTMENT_OTHER): Payer: BC Managed Care – PPO

## 2023-07-11 ENCOUNTER — Telehealth (HOSPITAL_BASED_OUTPATIENT_CLINIC_OR_DEPARTMENT_OTHER): Payer: Self-pay

## 2023-07-11 NOTE — Telephone Encounter (Signed)
Called and spoke with pt's mother who provided me with pt's cell phone #. No answer and VM full so unable to leave message.

## 2023-07-16 ENCOUNTER — Encounter (HOSPITAL_BASED_OUTPATIENT_CLINIC_OR_DEPARTMENT_OTHER): Payer: Self-pay | Admitting: Physical Therapy

## 2023-07-16 ENCOUNTER — Ambulatory Visit (HOSPITAL_BASED_OUTPATIENT_CLINIC_OR_DEPARTMENT_OTHER): Payer: BC Managed Care – PPO | Admitting: Physical Therapy

## 2023-07-16 DIAGNOSIS — M25561 Pain in right knee: Secondary | ICD-10-CM

## 2023-07-16 DIAGNOSIS — R262 Difficulty in walking, not elsewhere classified: Secondary | ICD-10-CM | POA: Diagnosis not present

## 2023-07-16 DIAGNOSIS — M6281 Muscle weakness (generalized): Secondary | ICD-10-CM | POA: Diagnosis not present

## 2023-07-16 NOTE — Therapy (Signed)
OUTPATIENT PHYSICAL THERAPY TREATMENT   Patient Name: Nathaniel Johnson MRN: [id number] DOB:Jun 29, 2006, 17 y.o., male Today's Date: 07/16/2023  END OF SESSION:  PT End of Session - 07/16/23 0931     Visit Number 7    Number of Visits 27    Date for PT Re-Evaluation 09/15/23    Authorization Type [employer]    PT Start Time 0930    PT Stop Time 1010    PT Time Calculation (min) 40 min    Activity Tolerance Patient tolerated treatment well    Behavior During Therapy [facility] for tasks assessed/performed                Past Medical History:  Diagnosis Date   Gynecomastia 01/01/2017   Seasonal allergies    Torn ACL    Past Surgical History:  Procedure Laterality Date   KNEE ARTHROSCOPY WITH ANTERIOR CRUCIATE LIGAMENT (ACL) REPAIR WITH HAMSTRING GRAFT Right 05/29/2023   Procedure: RIGHT KNEE ARTHROSCOPY WITH ANTERIOR CRUCIATE LIGAMENT (ACL) RECONSTRUCTION WITH QUADRICEPS AUTOGRAFT;  Surgeon: [name], MD;  Location: [facility];  Service: Orthopedics;  Laterality: Right;   KNEE ARTHROSCOPY WITH MENISCAL REPAIR Right 05/29/2023   Procedure: KNEE ARTHROSCOPY WITH MENISCAL REPAIR LATERAL;  Surgeon: [name], MD;  Location: [facility];  Service: Orthopedics;  Laterality: Right;   NO PAST SURGERIES     Patient Active Problem List   Diagnosis Date Noted   Rupture of anterior cruciate ligament of right knee 05/29/2023   Peripheral tear of lateral meniscus of right knee as current injury 05/29/2023   Seasonal allergies 07/18/2016   Attention and concentration deficit 06/30/2015    REFERRING PROVIDER: Steward Drone, MD  REFERRING DIAG:  S83.511A (ICD-10-CM) - Rupture of anterior cruciate ligament of right knee, initial encounter    Post op 6/25  PROCEDURE: 1.  Right knee anterior cruciate ligament reconstruction with quadriceps tendon autograft. 2.  Right knee lateral meniscal repair  Rationale for Evaluation and Treatment:  Rehabilitation  THERAPY DIAG:  Acute pain of right knee  Difficulty in walking, not elsewhere classified  Muscle weakness (generalized)  ONSET DATE: DOS 05/29/23  Days since surgery: 48  SUBJECTIVE:                                                                                                                                                                                           SUBJECTIVE STATEMENT: Pt states that the knee no longer hurts with exercise. Pt was able to do HEP without discomfort. Knee bending is improving. Pt advised by MD that he is not cleared to start basketball drills.  PERTINENT HISTORY:  none  PAIN:  Are you having pain? No  PRECAUTIONS:  Knee  WEIGHT BEARING RESTRICTIONS:  Yes POSTOPERATIVE PLAN: He will be weightbearing as tolerated on the right leg with the brace locked in extension.  He begin physical therapy immediately postop.    FALLS:  Has patient fallen in last 6 months? No  OCCUPATION:  student  PLOF:  Independent  PATIENT GOALS:  Back to PLOF  OBJECTIVE:   PATIENT SURVEYS:  LEFS EVAL: 33   TREATMENT:          8/12  107 flexion,  0 ext   Recumbent bike half rev 6 min for motion  Knee ext machine up2 down 1 10x 10lbs  (pt did leg day at gym yesterday)   Lady [place] ball squat 26lb KB 15x (L LE off) SL RDL 10x, 10lbs 3x10  Bosu SL runner's lunge 3x12 15lb lateral lunge 3x8 Reverse nordic 2x5    Treatment                            07/06/23:  Half rev on bike 5 min Lateral step down with quick up- cues for gluts SLS ABCs Standing SLR from elevated surface Supine hamstring stretch- midline & lateral Sidelying hip abduction: circles, hip flexion  7/30  107 flexion,  0 ext   Recumbent bike half rev 6 min for motion  Grade III R knee ext mob with tibial ER  2lb SLR 2x10 5lb LAQ 90-45 3x8  Full plank push up 3x failure  Sidestep, monster walks with ball tosse 27ft 3x  Table box squat 3x10  SLS 30s 2x on foam,  2x without  Heel tap 6" box 3x8     Treatment                            7/26:  Passive HS & ITB stretching IASTM HS & gastroc Standing gastroc stretch Backward walking & side stepping in mini squat   7/23  Assisted knee flexion 2x10 5s hold  SLR 3 way on plinth 3x10 Prone TKE 5" 2x10 Partial LAQ 90-60 3x10 Tailgate knee flexion stretch 5s 10x      Half squats tat rail 2x10 Standing HR/TR 3x10 Standing march 2x10 Fwd and backward walking full hall x1ea                                                  PATIENT EDUCATION:  Education details: Teacher, music of condition, POC, HEP, exercise form/rationale Person educated: Patient and Parent Education method: Explanation, Demonstration, Actor cues, Verbal cues, and Handouts Education comprehension: verbalized understanding, returned demonstration, verbal cues required, tactile cues required, and needs further education  HOME EXERCISE PROGRAM: [id number]   ASSESSMENT:  CLINICAL IMPRESSION: Pt nearly 7 wks at this time. Pt continues to make improvements in flexion ROM with 0 deg of ext. Pt was able to progress SL strengthening in [place] with very good tolerance to knee extension. Pt SL stability progressed today and pt demonstrates good LE control and awareness. However, pt does favor/weightshift to the L LE with loaded DL squat and balance exercise. HEP updated with frequency, volume, and intensity of gym exercise discussed. Continue with R LE strength and stability at next session. Pt would benefit from continued  skilled therapy in order to reach goals and maximize functional R LE strength and ROM for full return to PLOF.     REHAB POTENTIAL: Good  CLINICAL DECISION MAKING: Stable/uncomplicated  EVALUATION COMPLEXITY: Low   GOALS: Goals reviewed with patient? Yes  SHORT TERM GOALS: Target date: 7/27  Full avail knee ROM Baseline: 0 ext at eval, locked in ext Goal status: achieved  2.  10 SLR without quad lag, without  brace Baseline: unable at eval Goal status: achieved  3.  Proper gait pattern with brace donned Baseline: unable at eval, locked in extension Goal status: achieved    LONG TERM GOALS: Target date: POC date  LEFS to improve to at least 66/80 Baseline: 33 at eval Goal status: INITIAL  2.  LE MMT 85% of opp LE via hand held dynamometry testing Baseline: will test as appropriate Goal status: INITIAL  3.  Proper form with plyometrics & running Baseline: unable at eval Goal status: INITIAL  4.  Able to navigate balance on unstable surfaces without pain or compensation Baseline: unable at eval Goal status: INITIAL  5.  Able to navigate stairs without pain or compensation Baseline: unable at eval Goal status: INITIAL     PLAN:  PT FREQUENCY: 1-2x/week  PT DURATION: POC date  PLANNED INTERVENTIONS: Therapeutic exercises, Therapeutic activity, Neuromuscular re-education, Balance training, Gait training, Patient/Family education, Self Care, Joint mobilization, Stair training, Aquatic Therapy, Dry Needling, Electrical stimulation, Spinal mobilization, Cryotherapy, Moist heat, scar mobilization, Taping, Vasopneumatic device, Manual therapy, and Re-evaluation.  PLAN FOR NEXT SESSION: per protocol  Zebedee Iba PT, DPT 07/16/23 10:15 AM     Check all possible CPT codes: 34742 - PT Re-evaluation, 97110- Therapeutic Exercise, [phone]- Neuro Re-education, [phone] - Gait Training, [phone] - Manual Therapy, 97530 - Therapeutic Activities, [phone] - Self Care, [phone] - Electrical stimulation (unattended), [id number] - Vaso, [id number] - Physical performance training, and [phone] - Aquatic therapy    Check all conditions that are expected to impact treatment: {Conditions expected to impact treatment:None of these apply   If treatment provided at initial evaluation, no treatment charged due to lack of authorization.

## 2023-07-18 ENCOUNTER — Ambulatory Visit (HOSPITAL_BASED_OUTPATIENT_CLINIC_OR_DEPARTMENT_OTHER): Payer: BC Managed Care – PPO | Admitting: Physical Therapy

## 2023-07-18 ENCOUNTER — Encounter (HOSPITAL_BASED_OUTPATIENT_CLINIC_OR_DEPARTMENT_OTHER): Payer: Self-pay | Admitting: Physical Therapy

## 2023-07-18 DIAGNOSIS — M6281 Muscle weakness (generalized): Secondary | ICD-10-CM | POA: Diagnosis not present

## 2023-07-18 DIAGNOSIS — M25561 Pain in right knee: Secondary | ICD-10-CM

## 2023-07-18 DIAGNOSIS — R262 Difficulty in walking, not elsewhere classified: Secondary | ICD-10-CM

## 2023-07-18 NOTE — Therapy (Signed)
OUTPATIENT PHYSICAL THERAPY TREATMENT   Patient Name: Nathaniel Johnson MRN: [id number] DOB:May 26, 2006, 17 y.o., male Today's Date: 07/18/2023  END OF SESSION:  PT End of Session - 07/18/23 1018     Visit Number 8    Number of Visits 27    Date for PT Re-Evaluation 09/15/23    Authorization Type [employer]    PT Start Time 1015    PT Stop Time 1055    PT Time Calculation (min) 40 min    Activity Tolerance Patient tolerated treatment well    Behavior During Therapy [facility] for tasks assessed/performed                Past Medical History:  Diagnosis Date   Gynecomastia 01/01/2017   Seasonal allergies    Torn ACL    Past Surgical History:  Procedure Laterality Date   KNEE ARTHROSCOPY WITH ANTERIOR CRUCIATE LIGAMENT (ACL) REPAIR WITH HAMSTRING GRAFT Right 05/29/2023   Procedure: RIGHT KNEE ARTHROSCOPY WITH ANTERIOR CRUCIATE LIGAMENT (ACL) RECONSTRUCTION WITH QUADRICEPS AUTOGRAFT;  Surgeon: [name], MD;  Location: [facility];  Service: Orthopedics;  Laterality: Right;   KNEE ARTHROSCOPY WITH MENISCAL REPAIR Right 05/29/2023   Procedure: KNEE ARTHROSCOPY WITH MENISCAL REPAIR LATERAL;  Surgeon: [name], MD;  Location: [facility];  Service: Orthopedics;  Laterality: Right;   NO PAST SURGERIES     Patient Active Problem List   Diagnosis Date Noted   Rupture of anterior cruciate ligament of right knee 05/29/2023   Peripheral tear of lateral meniscus of right knee as current injury 05/29/2023   Seasonal allergies 07/18/2016   Attention and concentration deficit 06/30/2015    REFERRING PROVIDER: Steward Drone, MD  REFERRING DIAG:  S83.511A (ICD-10-CM) - Rupture of anterior cruciate ligament of right knee, initial encounter    Post op 6/25  PROCEDURE: 1.  Right knee anterior cruciate ligament reconstruction with quadriceps tendon autograft. 2.  Right knee lateral meniscal repair  Rationale for Evaluation and Treatment:  Rehabilitation  THERAPY DIAG:  Acute pain of right knee  Difficulty in walking, not elsewhere classified  Muscle weakness (generalized)  ONSET DATE: DOS 05/29/23  Days since surgery: 50  SUBJECTIVE:                                                                                                                                                                                           SUBJECTIVE STATEMENT: Pt states he is sore throughout the L LE, especially into HS. Feels like working out muscle soreness vs pain. Pt asks about basketball related activity and practice today.   PERTINENT HISTORY:  none  PAIN:  Are you having pain? Yes 5/10 soreness  PRECAUTIONS:  Knee  WEIGHT BEARING RESTRICTIONS:  Yes POSTOPERATIVE PLAN: He will be weightbearing as tolerated on the right leg with the brace locked in extension.  He begin physical therapy immediately postop.    FALLS:  Has patient fallen in last 6 months? No  OCCUPATION:  student  PLOF:  Independent  PATIENT GOALS:  Back to PLOF  OBJECTIVE:   PATIENT SURVEYS:  LEFS EVAL: 33   TREATMENT:          8/114  116 flexion,   2ext   STM to R HS, active release  Prone quad stretch 30s  R knee flexion, tibial posterior glide grade IV  SLS rebounder: fwd, single UE, L and R 20x each Iso lunge hold at wall 3s 3x6-8 Hexbar DL  Basketball SL chest pass, SL single UE pass  20x   8/12  107 flexion,  0 ext   Recumbent bike half rev 6 min for motion  Knee ext machine up2 down 1 10x 10lbs  (pt did leg day at gym yesterday)   Lady Gary ball squat 26lb KB 15x (L LE off) SL RDL 10x, 10lbs 3x10  Bosu SL runner's lunge 3x12 15lb lateral lunge 3x8 Reverse nordic 2x5    Treatment                            07/06/23:  Half rev on bike 5 min Lateral step down with quick up- cues for gluts SLS ABCs Standing SLR from elevated surface Supine hamstring stretch- midline & lateral Sidelying hip abduction: circles, hip  flexion  7/30  107 flexion,  0 ext   Recumbent bike half rev 6 min for motion  Grade III R knee ext mob with tibial ER  2lb SLR 2x10 5lb LAQ 90-45 3x8  Full plank push up 3x failure  Sidestep, monster walks with ball tosse 68ft 3x  Table box squat 3x10  SLS 30s 2x on foam, 2x without  Heel tap 6" box 3x8     Treatment                            7/26:  Passive HS & ITB stretching IASTM HS & gastroc Standing gastroc stretch Backward walking & side stepping in mini squat   7/23  Assisted knee flexion 2x10 5s hold  SLR 3 way on plinth 3x10 Prone TKE 5" 2x10 Partial LAQ 90-60 3x10 Tailgate knee flexion stretch 5s 10x      Half squats tat rail 2x10 Standing HR/TR 3x10 Standing march 2x10 Fwd and backward walking full hall x1ea                                                  PATIENT EDUCATION:  Education details: Teacher, music of condition, POC, HEP, exercise form/rationale Person educated: Patient and Parent Education method: Explanation, Demonstration, Actor cues, Verbal cues, and Handouts Education comprehension: verbalized understanding, returned demonstration, verbal cues required, tactile cues required, and needs further education  HOME EXERCISE PROGRAM: [email or web address]   ASSESSMENT:  CLINICAL IMPRESSION: Pt 7 wks at this time.  Patient able to reach 116 degrees of flexion today with 2 degrees of hyperextension following joint mobilizations and stretching.  Patient does present with  increase in soreness from increase in exercise loading at previous session.  Patient advised on frequency of exercise in addition to progression of gym exercise.  Patient advised on precautions of not bending greater 9 degrees in loaded position due to meniscus repair.  Patient verbalized understanding.  Home exercise program progressed today in order to improve loaded knee flexion at protocol limit.  Consider addition of more stability based exercise at future sessions.  Patient advised on  acceptable basketball passing drills at this time with focus on no dynamic motions yet.  Continue with R LE strength and stability at next session. Pt would benefit from continued skilled therapy in order to reach goals and maximize functional R LE strength and ROM for full return to PLOF.     REHAB POTENTIAL: Good  CLINICAL DECISION MAKING: Stable/uncomplicated  EVALUATION COMPLEXITY: Low   GOALS: Goals reviewed with patient? Yes  SHORT TERM GOALS: Target date: 7/27  Full avail knee ROM Baseline: 0 ext at eval, locked in ext Goal status: achieved  2.  10 SLR without quad lag, without brace Baseline: unable at eval Goal status: achieved  3.  Proper gait pattern with brace donned Baseline: unable at eval, locked in extension Goal status: achieved    LONG TERM GOALS: Target date: POC date  LEFS to improve to at least 66/80 Baseline: 33 at eval Goal status: INITIAL  2.  LE MMT 85% of opp LE via hand held dynamometry testing Baseline: will test as appropriate Goal status: INITIAL  3.  Proper form with plyometrics & running Baseline: unable at eval Goal status: INITIAL  4.  Able to navigate balance on unstable surfaces without pain or compensation Baseline: unable at eval Goal status: INITIAL  5.  Able to navigate stairs without pain or compensation Baseline: unable at eval Goal status: INITIAL     PLAN:  PT FREQUENCY: 1-2x/week  PT DURATION: POC date  PLANNED INTERVENTIONS: Therapeutic exercises, Therapeutic activity, Neuromuscular re-education, Balance training, Gait training, Patient/Family education, Self Care, Joint mobilization, Stair training, Aquatic Therapy, Dry Needling, Electrical stimulation, Spinal mobilization, Cryotherapy, Moist heat, scar mobilization, Taping, Vasopneumatic device, Manual therapy, and Re-evaluation.  PLAN FOR NEXT SESSION: per protocol  Zebedee Iba PT, DPT 07/18/23 10:58 AM     Check all possible CPT codes: 16109 - PT  Re-evaluation, 97110- Therapeutic Exercise, [phone]- Neuro Re-education, [phone] - Gait Training, [phone] - Manual Therapy, 97530 - Therapeutic Activities, [phone] - Self Care, [phone] - Electrical stimulation (unattended), 97016 - Vaso, [id number] - Physical performance training, and [phone] - Aquatic therapy    Check all conditions that are expected to impact treatment: {Conditions expected to impact treatment:None of these apply   If treatment provided at initial evaluation, no treatment charged due to lack of authorization.

## 2023-07-23 ENCOUNTER — Encounter (HOSPITAL_BASED_OUTPATIENT_CLINIC_OR_DEPARTMENT_OTHER): Payer: Self-pay

## 2023-07-23 ENCOUNTER — Ambulatory Visit (HOSPITAL_BASED_OUTPATIENT_CLINIC_OR_DEPARTMENT_OTHER): Payer: BC Managed Care – PPO

## 2023-07-23 DIAGNOSIS — M25561 Pain in right knee: Secondary | ICD-10-CM | POA: Diagnosis not present

## 2023-07-23 DIAGNOSIS — R262 Difficulty in walking, not elsewhere classified: Secondary | ICD-10-CM

## 2023-07-23 DIAGNOSIS — M6281 Muscle weakness (generalized): Secondary | ICD-10-CM

## 2023-07-23 NOTE — Therapy (Signed)
OUTPATIENT PHYSICAL THERAPY TREATMENT   Patient Name: Nathaniel Johnson MRN: [id number] DOB:[birth year], 17 y.o., male Today's Date: 07/23/2023  END OF SESSION:  PT End of Session - 07/23/23 0936     Visit Number 9    Number of Visits 27    Date for PT Re-Evaluation 09/15/23    Authorization Type [employer]    PT Start Time [phone]    PT Stop Time 1012    PT Time Calculation (min) 39 min    Activity Tolerance Patient tolerated treatment well    Behavior During Therapy [facility] for tasks assessed/performed                 Past Medical History:  Diagnosis Date   Gynecomastia 01/01/2017   Seasonal allergies    Torn ACL    Past Surgical History:  Procedure Laterality Date   KNEE ARTHROSCOPY WITH ANTERIOR CRUCIATE LIGAMENT (ACL) REPAIR WITH HAMSTRING GRAFT Right 05/29/2023   Procedure: RIGHT KNEE ARTHROSCOPY WITH ANTERIOR CRUCIATE LIGAMENT (ACL) RECONSTRUCTION WITH QUADRICEPS AUTOGRAFT;  Surgeon: [name], MD;  Location: [facility];  Service: Orthopedics;  Laterality: Right;   KNEE ARTHROSCOPY WITH MENISCAL REPAIR Right 05/29/2023   Procedure: KNEE ARTHROSCOPY WITH MENISCAL REPAIR LATERAL;  Surgeon: [name], MD;  Location:  SUR[facility]ERY [facility];  Service: Orthopedics;  Laterality: Right;   NO PAST SURGERIES     Patient Active Problem List   Diagnosis Date Noted   Rupture of anterior cruciate ligament of right knee 05/29/2023   Peripheral tear of lateral meniscus of right knee as current injury 05/29/2023   Seasonal allergies 07/18/2016   Attention and concentration deficit 06/30/2015    REFERRING PROVIDER: Steward Drone, MD  REFERRING DIAG:  S83.511A (ICD-10-CM) - Rupture of anterior cruciate ligament of right knee, initial encounter    Post op 6/25  PROCEDURE: 1.  Right knee anterior cruciate ligament reconstruction with quadriceps tendon autograft. 2.  Right knee lateral meniscal repair  Rationale for Evaluation and Treatment:  Rehabilitation  THERAPY DIAG:  Acute pain of right knee  Difficulty in walking, not elsewhere classified  Muscle weakness (generalized)  ONSET DATE: DOS 05/29/23  Days since surgery: 55  SUBJECTIVE:                                                                                                                                                                                           SUBJECTIVE STATEMENT: Pt reports no pain at entry. "It's actually been feeling pretty good." Had some muscle soreness after last session, but no pain.   PERTINENT HISTORY:  none  PAIN:  Are you having pain?  No  PRECAUTIONS:  Knee  WEIGHT BEARING RESTRICTIONS:  Yes POSTOPERATIVE PLAN: He will be weightbearing as tolerated on the right leg with the brace locked in extension.  He begin physical therapy immediately postop.    FALLS:  Has patient fallen in last 6 months? No  OCCUPATION:  student  PLOF:  Independent  PATIENT GOALS:  Back to PLOF  OBJECTIVE:   PATIENT SURVEYS:  LEFS EVAL: 33   TREATMENT:          8/19  116 flexion  Bike  R knee flexion, tibial posterior glide grade II-III, patella mobilizations Prone quad set 2.5# 5" 2x10 Front plank 15sec x5 (cues for form) Short to tall kneel 2x10   Stairs x1 flight reciprocal Eccentric retro step from 8" aerobic step 2x10 Laterall ccentric heel tap from 8" aerobic step 2x10 Eccentric fwd heel tap from 6" box 2x10  Eccentric stand to sit to 20inch box (!90deg knee flexion), normal stand up   8/114  116 flexion,   2ext   STM to R HS, active release  Prone quad stretch 30s  R knee flexion, tibial posterior glide grade IV  SLS rebounder: fwd, single UE, L and R 20x each Iso lunge hold at wall 3s 3x6-8 Hexbar DL  Basketball SL chest pass, SL single UE pass  20x   8/12  107 flexion,  0 ext   Recumbent bike half rev 6 min for motion  Knee ext machine up2 down 1 10x 10lbs  (pt did leg day at gym yesterday)    [place] ball squat 26lb KB 15x (L LE off) SL RDL 10x, 10lbs 3x10  Bosu SL runner's lunge 3x12 15lb lateral lunge 3x8 Reverse nordic 2x5    Treatment                            07/06/23:  Half rev on bike 5 min Lateral step down with quick up- cues for gluts SLS ABCs Standing SLR from elevated surface Supine hamstring stretch- midline & lateral Sidelying hip abduction: circles, hip flexion  7/30  107 flexion,  0 ext   Recumbent bike half rev 6 min for motion  Grade III R knee ext mob with tibial ER  2lb SLR 2x10 5lb LAQ 90-45 3x8  Full plank push up 3x failure  Sidestep, monster walks with ball tosse 9ft 3x  Table box squat 3x10  SLS 30s 2x on foam, 2x without  Heel tap 6" box 3x8                                                     PATIENT EDUCATION:  Education details: Anatomy of condition, POC, HEP, exercise form/rationale Person educated: Patient and Parent Education method: Explanation, Demonstration, Tactile cues, Verbal cues, and Handouts Education comprehension: verbalized understanding, returned demonstration, verbal cues required, tactile cues required, and needs further education  HOME EXERCISE PROGRAM: [email or web address]   ASSESSMENT:  CLINICAL IMPRESSION: Pt 8 wks tomorrow.  Pt stiff initially into knee flexion so spent time on stretching this. He was able to match previous measurements, but not able to exceed that this session. Mild swelling still observed. Difficulty observed with front planks with poor core endurance. Instructed to work on this at home. Overall good eccentric control noted with step downs to  sit downs. Pt has met LTG for stair climbing. Instructed pt to avoid running and dynamic activities until cleared by MD.     Fuller Mandril POTENTIAL: Good  CLINICAL DECISION MAKING: Stable/uncomplicated  EVALUATION COMPLEXITY: Low   GOALS: Goals reviewed with patient? Yes  SHORT TERM GOALS: Target date: 7/27  Full avail knee ROM Baseline: 0 ext at  eval, locked in ext Goal status: achieved  2.  10 SLR without quad lag, without brace Baseline: unable at eval Goal status: achieved  3.  Proper gait pattern with brace donned Baseline: unable at eval, locked in extension Goal status: achieved    LONG TERM GOALS: Target date: POC date  LEFS to improve to at least 66/80 Baseline: 33 at eval Goal status: INITIAL  2.  LE MMT 85% of opp LE via hand held dynamometry testing Baseline: will test as appropriate Goal status: INITIAL  3.  Proper form with plyometrics & running Baseline: unable at eval Goal status: INITIAL  4.  Able to navigate balance on unstable surfaces without pain or compensation Baseline: unable at eval Goal status: INITIAL  5.  Able to navigate stairs without pain or compensation Baseline: unable at eval Goal status: MET 8/19     PLAN:  PT FREQUENCY: 1-2x/week  PT DURATION: POC date  PLANNED INTERVENTIONS: Therapeutic exercises, Therapeutic activity, Neuromuscular re-education, Balance training, Gait training, Patient/Family education, Self Care, Joint mobilization, Stair training, Aquatic Therapy, Dry Needling, Electrical stimulation, Spinal mobilization, Cryotherapy, Moist heat, scar mobilization, Taping, Vasopneumatic device, Manual therapy, and Re-evaluation.  PLAN FOR NEXT SESSION: per protocol  Riki Altes, PTA  07/23/23 11:17 AM     Check all possible CPT codes: 36644 - PT Re-evaluation, 97110- Therapeutic Exercise, [phone]- Neuro Re-education, [phone] - Gait Training, [phone] - Manual Therapy, 97530 - Therapeutic Activities, [phone] - Self Care, [phone] - Electrical stimulation (unattended), 97016 - Vaso, [id number] - Physical performance training, and [phone] - Aquatic therapy    Check all conditions that are expected to impact treatment: {Conditions expected to impact treatment:None of these apply   If treatment provided at initial evaluation, no treatment charged due to lack of authorization.

## 2023-07-25 ENCOUNTER — Encounter (HOSPITAL_BASED_OUTPATIENT_CLINIC_OR_DEPARTMENT_OTHER): Payer: Self-pay

## 2023-07-25 ENCOUNTER — Ambulatory Visit (HOSPITAL_BASED_OUTPATIENT_CLINIC_OR_DEPARTMENT_OTHER): Payer: BC Managed Care – PPO

## 2023-07-25 ENCOUNTER — Ambulatory Visit (HOSPITAL_BASED_OUTPATIENT_CLINIC_OR_DEPARTMENT_OTHER): Payer: BC Managed Care – PPO | Admitting: Physical Therapy

## 2023-07-26 ENCOUNTER — Telehealth (HOSPITAL_BASED_OUTPATIENT_CLINIC_OR_DEPARTMENT_OTHER): Payer: Self-pay | Admitting: Physical Therapy

## 2023-07-26 NOTE — Telephone Encounter (Signed)
Called number in the patients chart. The number was the patient's father. The patients father made aware of 4th no-show. The patient's father states he will call the patient and talk to him. He was made aware that if he misses another we will have to cancel visits and schedule 1 at a time.

## 2023-07-30 ENCOUNTER — Ambulatory Visit (HOSPITAL_BASED_OUTPATIENT_CLINIC_OR_DEPARTMENT_OTHER): Payer: BC Managed Care – PPO

## 2023-08-01 ENCOUNTER — Ambulatory Visit (HOSPITAL_BASED_OUTPATIENT_CLINIC_OR_DEPARTMENT_OTHER): Payer: BC Managed Care – PPO | Admitting: Physical Therapy

## 2023-08-08 ENCOUNTER — Ambulatory Visit (HOSPITAL_BASED_OUTPATIENT_CLINIC_OR_DEPARTMENT_OTHER): Payer: BC Managed Care – PPO | Attending: Orthopaedic Surgery

## 2023-08-08 ENCOUNTER — Encounter (HOSPITAL_BASED_OUTPATIENT_CLINIC_OR_DEPARTMENT_OTHER): Payer: Self-pay

## 2023-08-08 ENCOUNTER — Ambulatory Visit (HOSPITAL_BASED_OUTPATIENT_CLINIC_OR_DEPARTMENT_OTHER): Payer: BC Managed Care – PPO | Admitting: Physical Therapy

## 2023-08-08 DIAGNOSIS — M25561 Pain in right knee: Secondary | ICD-10-CM | POA: Diagnosis not present

## 2023-08-08 DIAGNOSIS — M6281 Muscle weakness (generalized): Secondary | ICD-10-CM | POA: Insufficient documentation

## 2023-08-08 DIAGNOSIS — R262 Difficulty in walking, not elsewhere classified: Secondary | ICD-10-CM | POA: Insufficient documentation

## 2023-08-08 NOTE — Therapy (Signed)
OUTPATIENT PHYSICAL THERAPY TREATMENT/PN  Progress Note Reporting Period 06/02/23 to 09/15/23  See note below for Objective Data and Assessment of Progress/Goals.       Patient Name: Nathaniel Johnson MRN: [id number] DOB:March 12, 2006, 17 y.o., male Today's Date: 08/08/2023  END OF SESSION:  PT End of Session - 08/08/23 1053     Visit Number 10    Number of Visits 27    Date for PT Re-Evaluation 09/15/23    Authorization Type [employer]    PT Start Time 1104    PT Stop Time 1145    PT Time Calculation (min) 41 min    Activity Tolerance Patient tolerated treatment well    Behavior During Therapy [facility] for tasks assessed/performed                  Past Medical History:  Diagnosis Date   Gynecomastia 01/01/2017   Seasonal allergies    Torn ACL    Past Surgical History:  Procedure Laterality Date   KNEE ARTHROSCOPY WITH ANTERIOR CRUCIATE LIGAMENT (ACL) REPAIR WITH HAMSTRING GRAFT Right 05/29/2023   Procedure: RIGHT KNEE ARTHROSCOPY WITH ANTERIOR CRUCIATE LIGAMENT (ACL) RECONSTRUCTION WITH QUADRICEPS AUTOGRAFT;  Surgeon: [name], MD;  Location: [facility];  Service: Orthopedics;  Laterality: Right;   KNEE ARTHROSCOPY WITH MENISCAL REPAIR Right 05/29/2023   Procedure: KNEE ARTHROSCOPY WITH MENISCAL REPAIR LATERAL;  Surgeon: [name], MD;  Location: [facility];  Service: Orthopedics;  Laterality: Right;   NO PAST SURGERIES     Patient Active Problem List   Diagnosis Date Noted   Rupture of anterior cruciate ligament of right knee 05/29/2023   Peripheral tear of lateral meniscus of right knee as current injury 05/29/2023   Seasonal allergies 07/18/2016   Attention and concentration deficit 06/30/2015    REFERRING PROVIDER: Steward Drone, MD  REFERRING DIAG:  S83.511A (ICD-10-CM) - Rupture of anterior cruciate ligament of right knee, initial encounter    Post op 6/25  PROCEDURE: 1.  Right knee anterior cruciate ligament  reconstruction with quadriceps tendon autograft. 2.  Right knee lateral meniscal repair  Rationale for Evaluation and Treatment: Rehabilitation  THERAPY DIAG:  Acute pain of right knee  Muscle weakness (generalized)  Difficulty in walking, not elsewhere classified  ONSET DATE: DOS 05/29/23  Days since surgery: 71  SUBJECTIVE:                                                                                                                                                                                           SUBJECTIVE STATEMENT: Pt denies pain in knee at entry, but does have tightness.  Questions if he can use hinge brace when returning to basketball in the future. Also questions whether he can do some exercises in the pool.   PERTINENT HISTORY:  none  PAIN:  Are you having pain? No  PRECAUTIONS:  Knee  WEIGHT BEARING RESTRICTIONS:  Yes POSTOPERATIVE PLAN: He will be weightbearing as tolerated on the right leg with the brace locked in extension.  He begin physical therapy immediately postop.    FALLS:  Has patient fallen in last 6 months? No  OCCUPATION:  student  PLOF:  Independent  PATIENT GOALS:  Back to PLOF  OBJECTIVE:   PATIENT SURVEYS:  LEFS EVAL: 33 LEFS 9/4: 95%  LOWER EXTREMITY ROM:   Passive ROM Right eval Left eval Right  9/4  Hip flexion       Hip extension       Hip abduction       Hip adduction       Hip internal rotation       Hip external rotation       Knee flexion    114 passive, 110 active  Knee extension    0  Ankle dorsiflexion       Ankle plantarflexion       Ankle inversion       Ankle eversion        (Blank rows = not tested)    MMT 9/4: R knee extension: 4/5, R knee flexion: 5/5  TREATMENT:          9/4  110deg flexion AROM, 114 passively Updated MMT Updated goals Bike  R knee flexion, tibial posterior glide grade II-III, patella mobilizations Prone quad set 2.5# 5" 2x10 Front plank 15sec x5 (cues for  form)  Stairs x1 flight reciprocal Fwd step up 10" aerobic step 2x10 Lateral eccentric heel tap from 8" aerobic step 2x10 Eccentric fwd heel tap from 8" aerobic step 2x10  Eccentric stand to sit to 20inch box (~90deg knee flexion) 2x10  RDL with 6kg KB x10 (cues for form)  8/19  116 flexion  Bike  R knee flexion, tibial posterior glide grade II-III, patella mobilizations Prone quad set 2.5# 5" 2x10 Front plank 15sec x5 (cues for form) Short to tall kneel 2x10   Stairs x1 flight reciprocal Eccentric retro step from 8" aerobic step 2x10 Laterall ccentric heel tap from 8" aerobic step 2x10 Eccentric fwd heel tap from 6" box 2x10  Eccentric stand to sit to 20inch box (!90deg knee flexion), normal stand up   8/114  116 flexion,   2ext   STM to R HS, active release  Prone quad stretch 30s  R knee flexion, tibial posterior glide grade IV  SLS rebounder: fwd, single UE, L and R 20x each Iso lunge hold at wall 3s 3x6-8 Hexbar DL  Basketball SL chest pass, SL single UE pass  20x                                                PATIENT EDUCATION:  Education details: Anatomy of condition, POC, HEP, exercise form/rationale Person educated: Patient and Parent Education method: Explanation, Demonstration, Tactile cues, Verbal cues, and Handouts Education comprehension: verbalized understanding, returned demonstration, verbal cues required, tactile cues required, and needs further education  HOME EXERCISE PROGRAM: [email or web address]   ASSESSMENT:  CLINICAL IMPRESSION: Pt has attended 10 visits of  PT thus far and is progressing well towards goals. Has met all STG and 2/5 LTG. He lacks full knee flexion as evidenced by recent objective measures. MMT yields adequate strength for 10 weeks s/p. Edema remains present localized to knee area. Can worsen with increased activity. Educated pt in ways to manage this at home. Advised he discuss eventual brace use with RTS with surgeon. Pt able  to exercise in water at this time. Pt demonstrates excellent performance with eccentric strengthening tasks for healing level. Will continue to work towards remaining goals.     REHAB POTENTIAL: Good  CLINICAL DECISION MAKING: Stable/uncomplicated  EVALUATION COMPLEXITY: Low   GOALS: Goals reviewed with patient? Yes  SHORT TERM GOALS: Target date: 7/27  Full avail knee ROM Baseline: 0 ext at eval, locked in ext Goal status: achieved  2.  10 SLR without quad lag, without brace Baseline: unable at eval Goal status: achieved  3.  Proper gait pattern with brace donned Baseline: unable at eval, locked in extension Goal status: achieved    LONG TERM GOALS: Target date: POC date  LEFS to improve to at least 66/80 Baseline: 33 at eval Goal status: MET 9/4  2.  LE MMT 85% of opp LE via hand held dynamometry testing Baseline: will test as appropriate Goal status: INITIAL  3.  Proper form with plyometrics & running Baseline: unable at eval Goal status: INITIAL  4.  Able to navigate balance on unstable surfaces without pain or compensation Baseline: unable at eval Goal status: IN PROGRESS 9/4  5.  Able to navigate stairs without pain or compensation Baseline: unable at eval Goal status: MET 8/19     PLAN:  PT FREQUENCY: 1-2x/week  PT DURATION: POC date  PLANNED INTERVENTIONS: Therapeutic exercises, Therapeutic activity, Neuromuscular re-education, Balance training, Gait training, Patient/Family education, Self Care, Joint mobilization, Stair training, Aquatic Therapy, Dry Needling, Electrical stimulation, Spinal mobilization, Cryotherapy, Moist heat, scar mobilization, Taping, Vasopneumatic device, Manual therapy, and Re-evaluation.  PLAN FOR NEXT SESSION: per protocol  Riki Altes, PTA  08/08/23 12:10 PM     Check all possible CPT codes: 91478 - PT Re-evaluation, 97110- Therapeutic Exercise, [phone]- Neuro Re-education, [phone] - Gait Training, [phone] -  Manual Therapy, 97530 - Therapeutic Activities, [phone] - Self Care, [phone] - Electrical stimulation (unattended), 97016 - Vaso, [id number] - Physical performance training, and [phone] - Aquatic therapy    Check all conditions that are expected to impact treatment: {Conditions expected to impact treatment:None of these apply   If treatment provided at initial evaluation, no treatment charged due to lack of authorization.

## 2023-08-15 ENCOUNTER — Encounter (HOSPITAL_BASED_OUTPATIENT_CLINIC_OR_DEPARTMENT_OTHER): Payer: BC Managed Care – PPO | Admitting: Physical Therapy

## 2023-08-20 ENCOUNTER — Ambulatory Visit (HOSPITAL_BASED_OUTPATIENT_CLINIC_OR_DEPARTMENT_OTHER): Payer: BC Managed Care – PPO | Admitting: Physical Therapy

## 2023-08-20 ENCOUNTER — Encounter (HOSPITAL_BASED_OUTPATIENT_CLINIC_OR_DEPARTMENT_OTHER): Payer: Self-pay | Admitting: Physical Therapy

## 2023-08-20 DIAGNOSIS — M6281 Muscle weakness (generalized): Secondary | ICD-10-CM

## 2023-08-20 DIAGNOSIS — M25561 Pain in right knee: Secondary | ICD-10-CM | POA: Diagnosis not present

## 2023-08-20 DIAGNOSIS — R262 Difficulty in walking, not elsewhere classified: Secondary | ICD-10-CM | POA: Diagnosis not present

## 2023-08-20 NOTE — Therapy (Signed)
OUTPATIENT PHYSICAL THERAPY TREATMENT/PN     Patient Name: Nathaniel Johnson MRN: [id number] DOB:10-15-2006, 17 y.o., male Today's Date: 08/20/2023  END OF SESSION:  PT End of Session - 08/20/23 1147     Visit Number 11    Number of Visits 27    Date for PT Re-Evaluation 09/15/23    Authorization Type [employer]    PT Start Time 1147    PT Stop Time 1225    PT Time Calculation (min) 38 min    Activity Tolerance Patient tolerated treatment well    Behavior During Therapy [facility] for tasks assessed/performed                  Past Medical History:  Diagnosis Date   Gynecomastia 01/01/2017   Seasonal allergies    Torn ACL    Past Surgical History:  Procedure Laterality Date   KNEE ARTHROSCOPY WITH ANTERIOR CRUCIATE LIGAMENT (ACL) REPAIR WITH HAMSTRING GRAFT Right 05/29/2023   Procedure: RIGHT KNEE ARTHROSCOPY WITH ANTERIOR CRUCIATE LIGAMENT (ACL) RECONSTRUCTION WITH QUADRICEPS AUTOGRAFT;  Surgeon: [name], MD;  Location: [facility];  Service: Orthopedics;  Laterality: Right;   KNEE ARTHROSCOPY WITH MENISCAL REPAIR Right 05/29/2023   Procedure: KNEE ARTHROSCOPY WITH MENISCAL REPAIR LATERAL;  Surgeon: [name], MD;  Location: [facility];  Service: Orthopedics;  Laterality: Right;   NO PAST SURGERIES     Patient Active Problem List   Diagnosis Date Noted   Rupture of anterior cruciate ligament of right knee 05/29/2023   Peripheral tear of lateral meniscus of right knee as current injury 05/29/2023   Seasonal allergies 07/18/2016   Attention and concentration deficit 06/30/2015    REFERRING PROVIDER: Steward Drone, MD  REFERRING DIAG:  S83.511A (ICD-10-CM) - Rupture of anterior cruciate ligament of right knee, initial encounter    Post op 6/25  PROCEDURE: 1.  Right knee anterior cruciate ligament reconstruction with quadriceps tendon autograft. 2.  Right knee lateral meniscal repair  Rationale for Evaluation and Treatment:  Rehabilitation  THERAPY DIAG:  Acute pain of right knee  Muscle weakness (generalized)  Difficulty in walking, not elsewhere classified  ONSET DATE: DOS 05/29/23  Days since surgery: 83  SUBJECTIVE:                                                                                                                                                                                           SUBJECTIVE STATEMENT: No pain or anything, no swelling or anything.   PERTINENT HISTORY:  none  PAIN:  Are you having pain? No  PRECAUTIONS:  Knee  WEIGHT BEARING RESTRICTIONS:  Yes  POSTOPERATIVE PLAN: He will be weightbearing as tolerated on the right leg with the brace locked in extension.  He begin physical therapy immediately postop.    FALLS:  Has patient fallen in last 6 months? No  OCCUPATION:  student  PLOF:  Independent  PATIENT GOALS:  Back to PLOF  OBJECTIVE:   PATIENT SURVEYS:  LEFS EVAL: 33 LEFS 9/4: 95%  LOWER EXTREMITY ROM:   Passive ROM Right eval Left eval Right  9/4  Hip flexion       Hip extension       Hip abduction       Hip adduction       Hip internal rotation       Hip external rotation       Knee flexion    114 passive, 110 active  Knee extension    0  Ankle dorsiflexion       Ankle plantarflexion       Ankle inversion       Ankle eversion        (Blank rows = not tested)    MMT 9/4: R knee extension: 4/5, R knee flexion: 5/5 9/16: Rt 33.6 lb, Lt 39.1  Ybalance test 9/16 Right: fwd-65  Lat- 100  med- 92 Left: fwd- 54 lat- 107 med- 99  TREATMENT:          Treatment                            9/16:  Shuttle sidelying press & hops 1*12 Squat hops with focus on smooth transition to squat Squat hops lateral & fwd/back Diver +windmill SLS with ball maneuver around cones lateral and adducted across body   9/4  110deg flexion AROM, 114 passively Updated MMT Updated goals Bike  R knee flexion, tibial posterior glide grade  II-III, patella mobilizations Prone quad set 2.5# 5" 2x10 Front plank 15sec x5 (cues for form)  Stairs x1 flight reciprocal Fwd step up 10" aerobic step 2x10 Lateral eccentric heel tap from 8" aerobic step 2x10 Eccentric fwd heel tap from 8" aerobic step 2x10  Eccentric stand to sit to 20inch box (~90deg knee flexion) 2x10  RDL with 6kg KB x10 (cues for form)  8/19  116 flexion  Bike  R knee flexion, tibial posterior glide grade II-III, patella mobilizations Prone quad set 2.5# 5" 2x10 Front plank 15sec x5 (cues for form) Short to tall kneel 2x10   Stairs x1 flight reciprocal Eccentric retro step from 8" aerobic step 2x10 Laterall ccentric heel tap from 8" aerobic step 2x10 Eccentric fwd heel tap from 6" box 2x10  Eccentric stand to sit to 20inch box (!90deg knee flexion), normal stand up   8/114  116 flexion,   2ext   STM to R HS, active release  Prone quad stretch 30s  R knee flexion, tibial posterior glide grade IV  SLS rebounder: fwd, single UE, L and R 20x each Iso lunge hold at wall 3s 3x6-8 Hexbar DL  Basketball SL chest pass, SL single UE pass  20x                                                PATIENT EDUCATION:  Education details: Anatomy of condition, POC, HEP, exercise form/rationale Person educated: Patient and Parent Education method:  Explanation, Demonstration, Tactile cues, Verbal cues, and Handouts Education comprehension: verbalized understanding, returned demonstration, verbal cues required, tactile cues required, and needs further education  HOME EXERCISE PROGRAM: [id number]   ASSESSMENT:  CLINICAL IMPRESSION: MMT strength 85% opp LE. Fatigue noted proximally with progression of exercises. Advised that he is in the 12 wk restructuring phase of healing and to continue with focus on strength. Suggested knee compression sleeve rather than hinge brace.     REHAB POTENTIAL: Good  CLINICAL DECISION MAKING:  Stable/uncomplicated  EVALUATION COMPLEXITY: Low   GOALS: Goals reviewed with patient? Yes  SHORT TERM GOALS: Target date: 7/27  Full avail knee ROM Baseline: 0 ext at eval, locked in ext Goal status: achieved  2.  10 SLR without quad lag, without brace Baseline: unable at eval Goal status: achieved  3.  Proper gait pattern with brace donned Baseline: unable at eval, locked in extension Goal status: achieved    LONG TERM GOALS: Target date: POC date  LEFS to improve to at least 66/80 Baseline: 33 at eval Goal status: MET 9/4  2.  LE MMT 85% of opp LE via hand held dynamometry testing Baseline: will test as appropriate Goal status: INITIAL  3.  Proper form with plyometrics & running Baseline: unable at eval Goal status: INITIAL  4.  Able to navigate balance on unstable surfaces without pain or compensation Baseline: unable at eval Goal status: IN PROGRESS 9/4  5.  Able to navigate stairs without pain or compensation Baseline: unable at eval Goal status: MET 8/19     PLAN:  PT FREQUENCY: 1-2x/week  PT DURATION: POC date  PLANNED INTERVENTIONS: Therapeutic exercises, Therapeutic activity, Neuromuscular re-education, Balance training, Gait training, Patient/Family education, Self Care, Joint mobilization, Stair training, Aquatic Therapy, Dry Needling, Electrical stimulation, Spinal mobilization, Cryotherapy, Moist heat, scar mobilization, Taping, Vasopneumatic device, Manual therapy, and Re-evaluation.  PLAN FOR NEXT SESSION: per protocol  Daviona Herbert C. Cookie Pore PT, DPT 08/20/23 1:04 PM      Check all possible CPT codes: 72536 - PT Re-evaluation, 97110- Therapeutic Exercise, [phone]- Neuro Re-education, [phone] - Gait Training, [phone] - Manual Therapy, 97530 - Therapeutic Activities, [phone] - Self Care, [phone] - Electrical stimulation (unattended), 97016 - Vaso, [id number] - Physical performance training, and [phone] - Aquatic therapy    Check all conditions that are  expected to impact treatment: {Conditions expected to impact treatment:None of these apply   If treatment provided at initial evaluation, no treatment charged due to lack of authorization.

## 2023-08-22 ENCOUNTER — Encounter (HOSPITAL_BASED_OUTPATIENT_CLINIC_OR_DEPARTMENT_OTHER): Payer: BC Managed Care – PPO | Admitting: Physical Therapy

## 2023-08-22 ENCOUNTER — Ambulatory Visit (INDEPENDENT_AMBULATORY_CARE_PROVIDER_SITE_OTHER): Payer: Medicaid Other | Admitting: Orthopaedic Surgery

## 2023-08-22 DIAGNOSIS — S83511A Sprain of anterior cruciate ligament of right knee, initial encounter: Secondary | ICD-10-CM

## 2023-08-22 NOTE — Progress Notes (Signed)
Post Operative Evaluation    Procedure/Date of Surgery: Status post right ACL reconstruction 6/25  Interval History:    Presents today 12-week status post above procedure.  Overall he is doing extremely well.  He has been working today's recovery protocol.  His quad is improving significantly.  Denies any pain or residual instability.   PMH/PSH/Family History/Social History/Meds/Allergies:    Past Medical History:  Diagnosis Date   Gynecomastia 01/01/2017   Seasonal allergies    Torn ACL    Past Surgical History:  Procedure Laterality Date   KNEE ARTHROSCOPY WITH ANTERIOR CRUCIATE LIGAMENT (ACL) REPAIR WITH HAMSTRING GRAFT Right 05/29/2023   Procedure: RIGHT KNEE ARTHROSCOPY WITH ANTERIOR CRUCIATE LIGAMENT (ACL) RECONSTRUCTION WITH QUADRICEPS AUTOGRAFT;  Surgeon: [name], MD;  Location: [facility];  Service: Orthopedics;  Laterality: Right;   KNEE ARTHROSCOPY WITH MENISCAL REPAIR Right 05/29/2023   Procedure: KNEE ARTHROSCOPY WITH MENISCAL REPAIR LATERAL;  Surgeon: [name], MD;  Location: [facility];  Service: Orthopedics;  Laterality: Right;   NO PAST SURGERIES     Social History   Socioeconomic History   Marital status: Single    Spouse name: Not on file   Number of children: Not on file   Years of education: Not on file   Highest education level: Not on file  Occupational History   Not on file  Tobacco Use   Smoking status: Never   Smokeless tobacco: Never  Vaping Use   Vaping status: Never Used  Substance and Sexual Activity   Alcohol use: No   Drug use: Yes    Types: Marijuana    Comment: occas   Sexual activity: Not on file  Other Topics Concern   Not on file  Social History Narrative   Not on file   Social Determinants of Health   Financial Resource Strain: Not on file  Food Insecurity: Not on file  Transportation Needs: Not on file  Physical Activity: Not on file   Stress: Not on file  Social Connections: Not on file   Family History  Problem Relation Age of Onset   Hypertension Mother    Allergies  Allergen Reactions   Pollen Extract     hives   Current Outpatient Medications  Medication Sig Dispense Refill   aspirin EC 325 MG tablet Take 1 tablet (325 mg total) by mouth daily. 30 tablet 0   levocetirizine (XYZAL) 5 MG tablet Take 5 mg by mouth every evening.     oxyCODONE (ROXICODONE) 5 MG immediate release tablet Take 1 tablet (5 mg total) by mouth every 4 (four) hours as needed for severe pain or breakthrough pain. 10 tablet 0   No current facility-administered medications for this visit.   No results found.  Review of Systems:   A ROS was performed including pertinent positives and negatives as documented in the HPI.   Musculoskeletal Exam:    There were no vitals taken for this visit.  Right knee incisions are well-appearing without erythema or drainage.  Range of motion is from -3 - 135 degrees without pain.  Improved quadriceps atrophy with improved bulk, no effusion.  Negative Lachman  Imaging:      I personally reviewed and interpreted the radiographs.   Assessment:   12 weeks status post right knee ACL reconstruction with  quadriceps tendon autograft.  Overall he is doing extremely well.  At this time he will begin a return to jog and eventually return to run program.  He will continue without strengthening.  I continue to advise against any type of cutting or pivoting.  He does have some residual instability and to that effect we will plan for a ACL functional brace, in order to return to normal days of active living.  Plan :    -Return to clinic 12 weeks      I personally saw and evaluated the patient, and participated in the management and treatment plan.  [name], MD Attending Physician, Orthopedic Surgery  This document was dictated using Dragon voice recognition software. A reasonable attempt at  proof reading has been made to minimize errors.

## 2023-08-29 ENCOUNTER — Encounter (HOSPITAL_BASED_OUTPATIENT_CLINIC_OR_DEPARTMENT_OTHER): Payer: BC Managed Care – PPO

## 2023-09-05 ENCOUNTER — Ambulatory Visit (HOSPITAL_BASED_OUTPATIENT_CLINIC_OR_DEPARTMENT_OTHER): Payer: BC Managed Care – PPO | Admitting: Physical Therapy

## 2023-09-05 ENCOUNTER — Encounter (HOSPITAL_BASED_OUTPATIENT_CLINIC_OR_DEPARTMENT_OTHER): Payer: BC Managed Care – PPO | Admitting: Physical Therapy

## 2023-09-06 DIAGNOSIS — S83511A Sprain of anterior cruciate ligament of right knee, initial encounter: Secondary | ICD-10-CM | POA: Diagnosis not present

## 2023-09-10 ENCOUNTER — Ambulatory Visit (HOSPITAL_BASED_OUTPATIENT_CLINIC_OR_DEPARTMENT_OTHER): Payer: BC Managed Care – PPO | Attending: Orthopaedic Surgery | Admitting: Physical Therapy

## 2023-09-10 ENCOUNTER — Encounter (HOSPITAL_BASED_OUTPATIENT_CLINIC_OR_DEPARTMENT_OTHER): Payer: Self-pay | Admitting: Physical Therapy

## 2023-09-10 DIAGNOSIS — R262 Difficulty in walking, not elsewhere classified: Secondary | ICD-10-CM | POA: Diagnosis not present

## 2023-09-10 DIAGNOSIS — M25561 Pain in right knee: Secondary | ICD-10-CM | POA: Insufficient documentation

## 2023-09-10 DIAGNOSIS — M6281 Muscle weakness (generalized): Secondary | ICD-10-CM | POA: Insufficient documentation

## 2023-09-10 NOTE — Therapy (Signed)
OUTPATIENT PHYSICAL THERAPY TREATMENT     Patient Name: Nathaniel Johnson MRN: [id number] DOB:04/28/2006, 17 y.o., male Today's Date: 09/10/2023  END OF SESSION:  PT End of Session - 09/10/23 1052     Visit Number 12    Number of Visits 27    Date for PT Re-Evaluation 09/15/23    Authorization Type [employer]    PT Start Time 1100    PT Stop Time 1140    PT Time Calculation (min) 40 min    Activity Tolerance Patient tolerated treatment well    Behavior During Therapy [facility] for tasks assessed/performed                  Past Medical History:  Diagnosis Date   Gynecomastia 01/01/2017   Seasonal allergies    Torn ACL    Past Surgical History:  Procedure Laterality Date   KNEE ARTHROSCOPY WITH ANTERIOR CRUCIATE LIGAMENT (ACL) REPAIR WITH HAMSTRING GRAFT Right 05/29/2023   Procedure: RIGHT KNEE ARTHROSCOPY WITH ANTERIOR CRUCIATE LIGAMENT (ACL) RECONSTRUCTION WITH QUADRICEPS AUTOGRAFT;  Surgeon: [name], MD;  Location: [facility];  Service: Orthopedics;  Laterality: Right;   KNEE ARTHROSCOPY WITH MENISCAL REPAIR Right 05/29/2023   Procedure: KNEE ARTHROSCOPY WITH MENISCAL REPAIR LATERAL;  Surgeon: [name], MD;  Location:  [facility];  Service: Orthopedics;  Laterality: Right;   NO PAST SURGERIES     Patient Active Problem List   Diagnosis Date Noted   Rupture of anterior cruciate ligament of right knee 05/29/2023   Peripheral tear of lateral meniscus of right knee as current injury 05/29/2023   Seasonal allergies 07/18/2016   Attention and concentration deficit 06/30/2015    REFERRING PROVIDER: Steward Drone, MD  REFERRING DIAG:  S83.511A (ICD-10-CM) - Rupture of anterior cruciate ligament of right knee, initial encounter    Post op 6/25  PROCEDURE: 1.  Right knee anterior cruciate ligament reconstruction with quadriceps tendon autograft. 2.  Right knee lateral meniscal repair  Rationale for Evaluation and Treatment:  Rehabilitation  THERAPY DIAG:  Acute pain of right knee  Muscle weakness (generalized)  Difficulty in walking, not elsewhere classified  ONSET DATE: DOS 05/29/23  Days since surgery: 104  SUBJECTIVE:                                                                                                                                                                                           SUBJECTIVE STATEMENT: Pt states he has returned to running. He has had no pain. Pt reports playing basketball at this point.   PERTINENT HISTORY:  none  PAIN:  Are you having pain?  No  PRECAUTIONS:  Knee  WEIGHT BEARING RESTRICTIONS:  Yes POSTOPERATIVE PLAN: He will be weightbearing as tolerated on the right leg with the brace locked in extension.  He begin physical therapy immediately postop.    FALLS:  Has patient fallen in last 6 months? No  OCCUPATION:  student  PLOF:  Independent  PATIENT GOALS:  Back to PLOF  OBJECTIVE:   PATIENT SURVEYS:  LEFS EVAL: 33 LEFS 9/4: 95%  LOWER EXTREMITY ROM:   Passive ROM Right eval Left eval Right  9/4  Hip flexion       Hip extension       Hip abduction       Hip adduction       Hip internal rotation       Hip external rotation       Knee flexion    114 passive, 110 active  Knee extension    0  Ankle dorsiflexion       Ankle plantarflexion       Ankle inversion       Ankle eversion        (Blank rows = not tested)    MMT 9/4: R knee extension: 4/5, R knee flexion: 5/5 9/16: Rt 33.6 lb, Lt 39.1  10/7 R knee ext 5RM 45lbs  L knee ext 5RM 75llbs    Ybalance test 9/16 Right: fwd-65  Lat- 100  med- 92 Left: fwd- 54 lat- 107 med- 99  TREATMENT:          10/7   Elliptical warm up 6 min   Knee extension 3x5 R 40lbs SL hopping 30s 3x R (lateral hopping)  SLS squat to 18" box 3x10 Nordic HS curls 3x5 Fwd fall to catch 10x     Treatment                            9/16:  Shuttle sidelying press & hops 1*12 Squat  hops with focus on smooth transition to squat Squat hops lateral & fwd/back Diver +windmill SLS with ball maneuver around cones lateral and adducted across body   9/4  110deg flexion AROM, 114 passively Updated MMT Updated goals Bike  R knee flexion, tibial posterior glide grade II-III, patella mobilizations Prone quad set 2.5# 5" 2x10 Front plank 15sec x5 (cues for form)  Stairs x1 flight reciprocal Fwd step up 10" aerobic step 2x10 Lateral eccentric heel tap from 8" aerobic step 2x10 Eccentric fwd heel tap from 8" aerobic step 2x10  Eccentric stand to sit to 20inch box (~90deg knee flexion) 2x10  RDL with 6kg KB x10 (cues for form)  8/19  116 flexion  Bike  R knee flexion, tibial posterior glide grade II-III, patella mobilizations Prone quad set 2.5# 5" 2x10 Front plank 15sec x5 (cues for form) Short to tall kneel 2x10   Stairs x1 flight reciprocal Eccentric retro step from 8" aerobic step 2x10 Laterall ccentric heel tap from 8" aerobic step 2x10 Eccentric fwd heel tap from 6" box 2x10  Eccentric stand to sit to 20inch box (!90deg knee flexion), normal stand up   8/114  116 flexion,   2ext   STM to R HS, active release  Prone quad stretch 30s  R knee flexion, tibial posterior glide grade IV  SLS rebounder: fwd, single UE, L and R 20x each Iso lunge hold at wall 3s 3x6-8 Hexbar DL  Basketball SL chest pass, SL single UE pass  20x                                                PATIENT EDUCATION:  Education details: Anatomy of condition, POC, HEP, exercise form/rationale Person educated: Patient and Parent Education method: Explanation, Demonstration, Tactile cues, Verbal cues, and Handouts Education comprehension: verbalized understanding, returned demonstration, verbal cues required, tactile cues required, and needs further education  HOME EXERCISE PROGRAM: [email or web address]   ASSESSMENT:  CLINICAL IMPRESSION: Pt session focused on intro to  decel and plyometrics in addition to SL strength as pt has been working on exercise without systematic progression or focused goal of movement. Pt demos signficnat hip strategy with deceleration movements as well as with SL squatting. Pt advised on precautions at this time. He should not be playing basketball other than individuals drills in straight lines and should not perform cutting or full sprinting until tested with therapy. Overall control is much improved but still lack full motor control for 100% sprinting or intro to change direction. Plan for re-cert at next.     REHAB POTENTIAL: Good  CLINICAL DECISION MAKING: Stable/uncomplicated  EVALUATION COMPLEXITY: Low   GOALS: Goals reviewed with patient? Yes  SHORT TERM GOALS: Target date: 7/27  Full avail knee ROM Baseline: 0 ext at eval, locked in ext Goal status: achieved  2.  10 SLR without quad lag, without brace Baseline: unable at eval Goal status: achieved  3.  Proper gait pattern with brace donned Baseline: unable at eval, locked in extension Goal status: achieved    LONG TERM GOALS: Target date: POC date  LEFS to improve to at least 66/80 Baseline: 33 at eval Goal status: MET 9/4  2.  LE MMT 85% of opp LE via hand held dynamometry testing Baseline: will test as appropriate Goal status: INITIAL  3.  Proper form with plyometrics & running Baseline: unable at eval Goal status: INITIAL  4.  Able to navigate balance on unstable surfaces without pain or compensation Baseline: unable at eval Goal status: IN PROGRESS 9/4  5.  Able to navigate stairs without pain or compensation Baseline: unable at eval Goal status: MET 8/19     PLAN:  PT FREQUENCY: 1-2x/week  PT DURATION: POC date  PLANNED INTERVENTIONS: Therapeutic exercises, Therapeutic activity, Neuromuscular re-education, Balance training, Gait training, Patient/Family education, Self Care, Joint mobilization, Stair training, Aquatic Therapy, Dry  Needling, Electrical stimulation, Spinal mobilization, Cryotherapy, Moist heat, scar mobilization, Taping, Vasopneumatic device, Manual therapy, and Re-evaluation.  PLAN FOR NEXT SESSION: per protocol  Zebedee Iba PT, DPT 09/10/23 11:46 AM     Check all possible CPT codes: 54098 - PT Re-evaluation, 97110- Therapeutic Exercise, [phone]- Neuro Re-education, [phone] - Gait Training, [phone] - Manual Therapy, 97530 - Therapeutic Activities, [phone] - Self Care, [phone] - Electrical stimulation (unattended), 97016 - Vaso, [id number] - Physical performance training, and [phone] - Aquatic therapy    Check all conditions that are expected to impact treatment: {Conditions expected to impact treatment:None of these apply   If treatment provided at initial evaluation, no treatment charged due to lack of authorization.

## 2023-09-12 ENCOUNTER — Ambulatory Visit (HOSPITAL_BASED_OUTPATIENT_CLINIC_OR_DEPARTMENT_OTHER): Payer: BC Managed Care – PPO | Admitting: Physical Therapy

## 2023-09-12 ENCOUNTER — Encounter (HOSPITAL_BASED_OUTPATIENT_CLINIC_OR_DEPARTMENT_OTHER): Payer: Self-pay | Admitting: Physical Therapy

## 2023-09-12 ENCOUNTER — Encounter (HOSPITAL_BASED_OUTPATIENT_CLINIC_OR_DEPARTMENT_OTHER): Payer: BC Managed Care – PPO | Admitting: Physical Therapy

## 2023-09-12 DIAGNOSIS — M25561 Pain in right knee: Secondary | ICD-10-CM

## 2023-09-12 DIAGNOSIS — R262 Difficulty in walking, not elsewhere classified: Secondary | ICD-10-CM

## 2023-09-12 DIAGNOSIS — M6281 Muscle weakness (generalized): Secondary | ICD-10-CM

## 2023-09-12 NOTE — Therapy (Signed)
OUTPATIENT PHYSICAL THERAPY TREATMENT     Patient Name: Nathaniel Johnson MRN: [id number] DOB:07-26-2006, 17 y.o., male Today's Date: 09/12/2023  END OF SESSION:  PT End of Session - 09/12/23 1148     Visit Number 13    Number of Visits 27    Date for PT Re-Evaluation 12/11/23    Authorization Type [employer]    PT Start Time 1146    PT Stop Time 1226    PT Time Calculation (min) 40 min    Activity Tolerance Patient tolerated treatment well    Behavior During Therapy [facility] for tasks assessed/performed                  Past Medical History:  Diagnosis Date   Gynecomastia 01/01/2017   Seasonal allergies    Torn ACL    Past Surgical History:  Procedure Laterality Date   KNEE ARTHROSCOPY WITH ANTERIOR CRUCIATE LIGAMENT (ACL) REPAIR WITH HAMSTRING GRAFT Right 05/29/2023   Procedure: RIGHT KNEE ARTHROSCOPY WITH ANTERIOR CRUCIATE LIGAMENT (ACL) RECONSTRUCTION WITH QUADRICEPS AUTOGRAFT;  Surgeon: [name], MD;  Location: [facility];  Service: Orthopedics;  Laterality: Right;   KNEE ARTHROSCOPY WITH MENISCAL REPAIR Right 05/29/2023   Procedure: KNEE ARTHROSCOPY WITH MENISCAL REPAIR LATERAL;  Surgeon: [name], MD;  Location: [facility];  Service: Orthopedics;  Laterality: Right;   NO PAST SURGERIES     Patient Active Problem List   Diagnosis Date Noted   Rupture of anterior cruciate ligament of right knee 05/29/2023   Peripheral tear of lateral meniscus of right knee as current injury 05/29/2023   Seasonal allergies 07/18/2016   Attention and concentration deficit 06/30/2015    REFERRING PROVIDER: Steward Drone, MD  REFERRING DIAG:  S83.511A (ICD-10-CM) - Rupture of anterior cruciate ligament of right knee, initial encounter    Post op 6/25  PROCEDURE: 1.  Right knee anterior cruciate ligament reconstruction with quadriceps tendon autograft. 2.  Right knee lateral meniscal repair  Rationale for Evaluation and Treatment:  Rehabilitation  THERAPY DIAG:  Acute pain of right knee  Muscle weakness (generalized)  Difficulty in walking, not elsewhere classified  ONSET DATE: DOS 05/29/23  Days since surgery: 106  SUBJECTIVE:                                                                                                                                                                                           SUBJECTIVE STATEMENT: Pt states the HS is sore today from nordics but no pain or swelling in the knee.   PERTINENT HISTORY:  none  PAIN:  Are you having pain? No  PRECAUTIONS:  Knee  WEIGHT BEARING RESTRICTIONS:  Yes POSTOPERATIVE PLAN: He will be weightbearing as tolerated on the right leg with the brace locked in extension.  He begin physical therapy immediately postop.    FALLS:  Has patient fallen in last 6 months? No  OCCUPATION:  student  PLOF:  Independent  PATIENT GOALS:  Back to PLOF  OBJECTIVE:   PATIENT SURVEYS:  LEFS EVAL: 33 LEFS 9/4: 95%  LEFS 10/4: 97%  LOWER EXTREMITY ROM:   Passive ROM Right eval Left eval Right  9/4 10/9  Hip flexion        Hip extension        Hip abduction        Hip adduction        Hip internal rotation        Hip external rotation        Knee flexion    114 passive, 110 active 130  Knee extension    0 0  Ankle dorsiflexion        Ankle plantarflexion        Ankle inversion        Ankle eversion         (Blank rows = not tested)    MMT 9/4: R knee extension: 4/5, R knee flexion: 5/5 9/16: Rt 33.6 lb, Lt 39.1  10/7 R knee ext 5RM 45lbs  L knee ext 5RM 75llbs   Functional Test: 10 yrd sprint [facility]  Sprint to stop: decreased eccentric lowering control; hip strategy   Ybalance test 9/16 Right: fwd-65  Lat- 100  med- 92 Left: fwd- 54 lat- 107 med- 99  TREATMENT:      10/9  5 min track jog   Build up runs 25%-100% 10 yrds (baseline to 3pt or VB line)     Sprint to stop 5x 10 yrds 75% speed Sprint to back peddle 5x  5yards  4x 50% speed Snap downs with ball 2x10 each  (DL and SL) Bounds 10 yards 4x    10/7   Elliptical warm up 6 min   Knee extension 3x5 R 40lbs SL hopping 30s 3x R (lateral hopping)  SLS squat to 18" box 3x10 Nordic HS curls 3x5 Fwd fall to catch 10x     Treatment                            9/16:  Shuttle sidelying press & hops 1*12 Squat hops with focus on smooth transition to squat Squat hops lateral & fwd/back Diver +windmill SLS with ball maneuver around cones lateral and adducted across body   9/4  110deg flexion AROM, 114 passively Updated MMT Updated goals Bike  R knee flexion, tibial posterior glide grade II-III, patella mobilizations Prone quad set 2.5# 5" 2x10 Front plank 15sec x5 (cues for form)  Stairs x1 flight reciprocal Fwd step up 10" aerobic step 2x10 Lateral eccentric heel tap from 8" aerobic step 2x10 Eccentric fwd heel tap from 8" aerobic step 2x10  Eccentric stand to sit to 20inch box (~90deg knee flexion) 2x10  RDL with 6kg KB x10 (cues for form)  8/19  116 flexion  Bike  R knee flexion, tibial posterior glide grade II-III, patella mobilizations Prone quad set 2.5# 5" 2x10 Front plank 15sec x5 (cues for form) Short to tall kneel 2x10   Stairs x1 flight reciprocal Eccentric retro step from 8" aerobic step 2x10 Laterall ccentric heel tap  from 8" aerobic step 2x10 Eccentric fwd heel tap from 6" box 2x10  Eccentric stand to sit to 20inch box (!90deg knee flexion), normal stand up   8/114  116 flexion,   2ext   STM to R HS, active release  Prone quad stretch 30s  R knee flexion, tibial posterior glide grade IV  SLS rebounder: fwd, single UE, L and R 20x each Iso lunge hold at wall 3s 3x6-8 Hexbar DL  Basketball SL chest pass, SL single UE pass  20x                                                PATIENT EDUCATION:  Education details: Teacher, music of condition, POC, HEP, exercise form/rationale Person  educated: Patient and Parent Education method: Explanation, Demonstration, Tactile cues, Verbal cues, and Handouts Education comprehension: verbalized understanding, returned demonstration, verbal cues required, tactile cues required, and needs further education  HOME EXERCISE PROGRAM: [id number]   ASSESSMENT:  CLINICAL IMPRESSION: Overall control is much improved but still lack full motor control for longer distance 100% sprinting or intro to change direction. Pt 5RM of R knee ext at 60% of L at this time. Functional testing reveals lack of eccentric control and deceleration with distance >10 yrds However, pt able to maintain good technique with 10 yrds or less. Pt still unsafe at this time to perform full speed change of direction. Plan to progress R quad strength and progress into curvilinear and straight line stopping. Pt would benefit from continued skilled therapy in order to reach goals and maximize functional R LE strength and ROM for full return to PLOF.    REHAB POTENTIAL: Good  CLINICAL DECISION MAKING: Stable/uncomplicated  EVALUATION COMPLEXITY: Low   GOALS: Goals reviewed with patient? Yes  SHORT TERM GOALS: Target date: 7/27  Full avail knee ROM Baseline: 0 ext at eval, locked in ext Goal status: achieved  2.  10 SLR without quad lag, without brace Baseline: unable at eval Goal status: achieved  3.  Proper gait pattern with brace donned Baseline: unable at eval, locked in extension Goal status: achieved    LONG TERM GOALS: Target date: POC date  LEFS to improve to at least 66/80 Baseline: 33 at eval Goal status: MET 9/4  2.  LE MMT 85% of opp LE via hand held dynamometry testing Baseline: will test as appropriate Goal status: INITIAL  3.  Proper form with plyometrics & running Baseline: unable at eval Goal status: INITIAL  4.  Able to navigate balance on unstable surfaces without pain or compensation Baseline: unable at eval Goal status: IN  PROGRESS 9/4  5.  Able to navigate stairs without pain or compensation Baseline: unable at eval Goal status: MET 8/19  6. Pt will be able to demonstrate ability to run/sprint/cut without movement deviations in order to demonstrate functional improvement and tolerance to higher plyometric loading.  Goal status: INITIAL  7. Pt will be able to demonstrate 90% QI  in order to demonstrate functional improvement in LE function for return to PLOF.   Goal status: INITIAL   PLAN:  PT FREQUENCY: 1-2x/week  PT DURATION: POC date  PLANNED INTERVENTIONS: Therapeutic exercises, Therapeutic activity, Neuromuscular re-education, Balance training, Gait training, Patient/Family education, Self Care, Joint mobilization, Stair training, Aquatic Therapy, Dry Needling, Electrical stimulation, Spinal mobilization, Cryotherapy, Moist heat, scar mobilization, Taping, Vasopneumatic device, Manual  therapy, and Re-evaluation.  PLAN FOR NEXT SESSION: per protocol  Zebedee Iba PT, DPT 09/12/23 1:14 PM     Check all possible CPT codes: 16109 - PT Re-evaluation, 97110- Therapeutic Exercise, [phone]- Neuro Re-education, [phone] - Gait Training, [phone] - Manual Therapy, 97530 - Therapeutic Activities, [phone] - Self Care, [phone] - Electrical stimulation (unattended), 97016 - Vaso, [id number] - Physical performance training, and [phone] - Aquatic therapy    Check all conditions that are expected to impact treatment: {Conditions expected to impact treatment:None of these apply   If treatment provided at initial evaluation, no treatment charged due to lack of authorization.

## 2023-09-17 ENCOUNTER — Ambulatory Visit (HOSPITAL_BASED_OUTPATIENT_CLINIC_OR_DEPARTMENT_OTHER): Payer: BC Managed Care – PPO | Admitting: Physical Therapy

## 2023-09-19 ENCOUNTER — Encounter (HOSPITAL_BASED_OUTPATIENT_CLINIC_OR_DEPARTMENT_OTHER): Payer: BC Managed Care – PPO | Admitting: Physical Therapy

## 2023-09-19 ENCOUNTER — Ambulatory Visit (HOSPITAL_BASED_OUTPATIENT_CLINIC_OR_DEPARTMENT_OTHER): Payer: BC Managed Care – PPO

## 2023-09-19 ENCOUNTER — Encounter (HOSPITAL_BASED_OUTPATIENT_CLINIC_OR_DEPARTMENT_OTHER): Payer: Self-pay

## 2023-09-19 DIAGNOSIS — M6281 Muscle weakness (generalized): Secondary | ICD-10-CM | POA: Diagnosis not present

## 2023-09-19 DIAGNOSIS — M25561 Pain in right knee: Secondary | ICD-10-CM

## 2023-09-19 DIAGNOSIS — R262 Difficulty in walking, not elsewhere classified: Secondary | ICD-10-CM | POA: Diagnosis not present

## 2023-09-19 NOTE — Therapy (Signed)
OUTPATIENT PHYSICAL THERAPY TREATMENT     Patient Name: Nathaniel Johnson MRN: [id number] DOB:05/20/2006, 17 y.o., male Today's Date: 09/19/2023  END OF SESSION:  PT End of Session - 09/19/23 1451     Visit Number 14    Number of Visits 27    Date for PT Re-Evaluation 12/11/23    Authorization Type [employer]    PT Start Time 1448    PT Stop Time 1516    PT Time Calculation (min) 28 min    Activity Tolerance Patient tolerated treatment well    Behavior During Therapy [facility] for tasks assessed/performed                   Past Medical History:  Diagnosis Date   Gynecomastia 01/01/2017   Seasonal allergies    Torn ACL    Past Surgical History:  Procedure Laterality Date   KNEE ARTHROSCOPY WITH ANTERIOR CRUCIATE LIGAMENT (ACL) REPAIR WITH HAMSTRING GRAFT Right 05/29/2023   Procedure: RIGHT KNEE ARTHROSCOPY WITH ANTERIOR CRUCIATE LIGAMENT (ACL) RECONSTRUCTION WITH QUADRICEPS AUTOGRAFT;  Surgeon: [name], MD;  Location: [facility];  Service: Orthopedics;  Laterality: Right;   KNEE ARTHROSCOPY WITH MENISCAL REPAIR Right 05/29/2023   Procedure: KNEE ARTHROSCOPY WITH MENISCAL REPAIR LATERAL;  Surgeon: [name], MD;  Location: [facility];  Service: Orthopedics;  Laterality: Right;   NO PAST SURGERIES     Patient Active Problem List   Diagnosis Date Noted   Rupture of anterior cruciate ligament of right knee 05/29/2023   Peripheral tear of lateral meniscus of right knee as current injury 05/29/2023   Seasonal allergies 07/18/2016   Attention and concentration deficit 06/30/2015    REFERRING PROVIDER: Steward Drone, MD  REFERRING DIAG:  S83.511A (ICD-10-CM) - Rupture of anterior cruciate ligament of right knee, initial encounter    Post op 6/25  PROCEDURE: 1.  Right knee anterior cruciate ligament reconstruction with quadriceps tendon autograft. 2.  Right knee lateral meniscal repair  Rationale for Evaluation and Treatment:  Rehabilitation  THERAPY DIAG:  Acute pain of right knee  Difficulty in walking, not elsewhere classified  Muscle weakness (generalized)  ONSET DATE: DOS 05/29/23  Days since surgery: 113  SUBJECTIVE:                                                                                                                                                                                           SUBJECTIVE STATEMENT: Pt states "my knee feels normal today."   PERTINENT HISTORY:  none  PAIN:  Are you having pain? No  PRECAUTIONS:  Knee  WEIGHT BEARING RESTRICTIONS:  Yes  POSTOPERATIVE PLAN: He will be weightbearing as tolerated on the right leg with the brace locked in extension.  He begin physical therapy immediately postop.    FALLS:  Has patient fallen in last 6 months? No  OCCUPATION:  student  PLOF:  Independent  PATIENT GOALS:  Back to PLOF  OBJECTIVE:   PATIENT SURVEYS:  LEFS EVAL: 33 LEFS 9/4: 95%  LEFS 10/4: 97%  LOWER EXTREMITY ROM:   Passive ROM Right eval Left eval Right  9/4 10/9  Hip flexion        Hip extension        Hip abduction        Hip adduction        Hip internal rotation        Hip external rotation        Knee flexion    114 passive, 110 active 130  Knee extension    0 0  Ankle dorsiflexion        Ankle plantarflexion        Ankle inversion        Ankle eversion         (Blank rows = not tested)    MMT 9/4: R knee extension: 4/5, R knee flexion: 5/5 9/16: Rt 33.6 lb, Lt 39.1  10/7 R knee ext 5RM 45lbs  L knee ext 5RM 75llbs   Functional Test: 10 yrd sprint [facility]  Sprint to stop: decreased eccentric lowering control; hip strategy   Ybalance test 9/16 Right: fwd-65  Lat- 100  med- 92 Left: fwd- 54 lat- 107 med- 99  TREATMENT:      10/16   Elliptical warm up 4 min   Step ups (concrete wall~14inches) 2x10 R Runs 75% speed - 15yds xlaps with focus  on quick deceleration Run 10yds, back pedal 5yds x3laps Broad jumps  10yds x3laps Side shuffling 10yds x3laps  Knee extension 2x10 R 40lbs  Jump down DL to SL from curb (4inch easy, so completed from 6 inch height) 2x10   10/9  5 min track jog   Build up runs 25%-100% 10 yrds (baseline to 3pt or VB line)     Sprint to stop 5x 10 yrds 75% speed Sprint to back peddle 5x 5yards  4x 50% speed Snap downs with ball 2x10 each  (DL and SL) Bounds 10 yards 4x    10/7   Elliptical warm up 6 min   Knee extension 3x5 R 40lbs SL hopping 30s 3x R (lateral hopping)  SLS squat to 18" box 3x10 Nordic HS curls 3x5 Fwd fall to catch 10x     Treatment                            9/16:  Shuttle sidelying press & hops 1*12 Squat hops with focus on smooth transition to squat Squat hops lateral & fwd/back Diver +windmill SLS with ball maneuver around cones lateral and adducted across body   9/4  110deg flexion AROM, 114 passively Updated MMT Updated goals Bike  R knee flexion, tibial posterior glide grade II-III, patella mobilizations Prone quad set 2.5# 5" 2x10 Front plank 15sec x5 (cues for form)  Stairs x1 flight reciprocal Fwd step up 10" aerobic step 2x10 Lateral eccentric heel tap from 8" aerobic step 2x10 Eccentric fwd heel tap from 8" aerobic step 2x10  Eccentric stand to sit to 20inch box (~90deg knee flexion) 2x10  RDL with 6kg KB x10 (  cues for form)  8/19  116 flexion  Bike  R knee flexion, tibial posterior glide grade II-III, patella mobilizations Prone quad set 2.5# 5" 2x10 Front plank 15sec x5 (cues for form) Short to tall kneel 2x10   Stairs x1 flight reciprocal Eccentric retro step from 8" aerobic step 2x10 Laterall ccentric heel tap from 8" aerobic step 2x10 Eccentric fwd heel tap from 6" box 2x10  Eccentric stand to sit to 20inch box (!90deg knee flexion), normal stand up   8/114  116 flexion,   2ext   STM to R HS, active release  Prone quad stretch 30s  R knee flexion, tibial posterior glide  grade IV  SLS rebounder: fwd, single UE, L and R 20x each Iso lunge hold at wall 3s 3x6-8 Hexbar DL  Basketball SL chest pass, SL single UE pass  20x                                                PATIENT EDUCATION:  Education details: Anatomy of condition, POC, HEP, exercise form/rationale Person educated: Patient and Parent Education method: Explanation, Demonstration, Tactile cues, Verbal cues, and Handouts Education comprehension: verbalized understanding, returned demonstration, verbal cues required, tactile cues required, and needs further education  HOME EXERCISE PROGRAM: [email or web address]   ASSESSMENT:  CLINICAL IMPRESSION: Pt did not bring brace today, so carefully monitored stability/pain level. No pain or difficulty reported with any exercises. Pt able to demonstrate good single leg control with hop downs and eccentric step downs. Discussed with pt his PT goals and expectations  as he builds strength.    REHAB POTENTIAL: Good  CLINICAL DECISION MAKING: Stable/uncomplicated  EVALUATION COMPLEXITY: Low   GOALS: Goals reviewed with patient? Yes  SHORT TERM GOALS: Target date: 7/27  Full avail knee ROM Baseline: 0 ext at eval, locked in ext Goal status: achieved  2.  10 SLR without quad lag, without brace Baseline: unable at eval Goal status: achieved  3.  Proper gait pattern with brace donned Baseline: unable at eval, locked in extension Goal status: achieved    LONG TERM GOALS: Target date: POC date  LEFS to improve to at least 66/80 Baseline: 33 at eval Goal status: MET 9/4  2.  LE MMT 85% of opp LE via hand held dynamometry testing Baseline: will test as appropriate Goal status: INITIAL  3.  Proper form with plyometrics & running Baseline: unable at eval Goal status: INITIAL  4.  Able to navigate balance on unstable surfaces without pain or compensation Baseline: unable at eval Goal status: IN PROGRESS 9/4  5.  Able to navigate stairs without  pain or compensation Baseline: unable at eval Goal status: MET 8/19  6. Pt will be able to demonstrate ability to run/sprint/cut without movement deviations in order to demonstrate functional improvement and tolerance to higher plyometric loading.  Goal status: INITIAL  7. Pt will be able to demonstrate 90% QI  in order to demonstrate functional improvement in LE function for return to PLOF.   Goal status: INITIAL   PLAN:  PT FREQUENCY: 1-2x/week  PT DURATION: POC date  PLANNED INTERVENTIONS: Therapeutic exercises, Therapeutic activity, Neuromuscular re-education, Balance training, Gait training, Patient/Family education, Self Care, Joint mobilization, Stair training, Aquatic Therapy, Dry Needling, Electrical stimulation, Spinal mobilization, Cryotherapy, Moist heat, scar mobilization, Taping, Vasopneumatic device, Manual therapy, and Re-evaluation.  PLAN FOR  NEXT SESSION: per protocol  Riki Altes, PTA  09/19/23 3:32 PM     Check all possible CPT codes: 29562 - PT Re-evaluation, 97110- Therapeutic Exercise, [phone]- Neuro Re-education, [phone] - Gait Training, [phone] - Manual Therapy, 97530 - Therapeutic Activities, [phone] - Self Care, [phone] - Electrical stimulation (unattended), [id number] - Vaso, [id number] - Physical performance training, and [phone] - Aquatic therapy    Check all conditions that are expected to impact treatment: {Conditions expected to impact treatment:None of these apply   If treatment provided at initial evaluation, no treatment charged due to lack of authorization.

## 2023-09-24 ENCOUNTER — Ambulatory Visit (HOSPITAL_BASED_OUTPATIENT_CLINIC_OR_DEPARTMENT_OTHER): Payer: BC Managed Care – PPO | Admitting: Physical Therapy

## 2023-09-26 ENCOUNTER — Encounter (HOSPITAL_BASED_OUTPATIENT_CLINIC_OR_DEPARTMENT_OTHER): Payer: BC Managed Care – PPO | Admitting: Physical Therapy

## 2023-09-26 ENCOUNTER — Encounter (HOSPITAL_BASED_OUTPATIENT_CLINIC_OR_DEPARTMENT_OTHER): Payer: Medicaid Other | Admitting: Physical Therapy

## 2023-10-01 ENCOUNTER — Ambulatory Visit (HOSPITAL_BASED_OUTPATIENT_CLINIC_OR_DEPARTMENT_OTHER): Payer: BC Managed Care – PPO | Admitting: Physical Therapy

## 2023-10-01 ENCOUNTER — Encounter (HOSPITAL_BASED_OUTPATIENT_CLINIC_OR_DEPARTMENT_OTHER): Payer: Self-pay | Admitting: Physical Therapy

## 2023-10-01 DIAGNOSIS — R262 Difficulty in walking, not elsewhere classified: Secondary | ICD-10-CM | POA: Diagnosis not present

## 2023-10-01 DIAGNOSIS — M25561 Pain in right knee: Secondary | ICD-10-CM | POA: Diagnosis not present

## 2023-10-01 DIAGNOSIS — M6281 Muscle weakness (generalized): Secondary | ICD-10-CM | POA: Diagnosis not present

## 2023-10-01 NOTE — Therapy (Signed)
OUTPATIENT PHYSICAL THERAPY TREATMENT     Patient Name: Nathaniel Johnson MRN: [id number] DOB:2006-08-12, 17 y.o., male Today's Date: 10/01/2023  END OF SESSION:  PT End of Session - 10/01/23 1103     Visit Number 16    Number of Visits 27    Date for PT Re-Evaluation 12/11/23    Authorization Type [employer]    PT Start Time 1103    PT Stop Time 1146    PT Time Calculation (min) 43 min    Activity Tolerance Patient tolerated treatment well    Behavior During Therapy [facility] for tasks assessed/performed                   Past Medical History:  Diagnosis Date   Gynecomastia 01/01/2017   Seasonal allergies    Torn ACL    Past Surgical History:  Procedure Laterality Date   KNEE ARTHROSCOPY WITH ANTERIOR CRUCIATE LIGAMENT (ACL) REPAIR WITH HAMSTRING GRAFT Right 05/29/2023   Procedure: RIGHT KNEE ARTHROSCOPY WITH ANTERIOR CRUCIATE LIGAMENT (ACL) RECONSTRUCTION WITH QUADRICEPS AUTOGRAFT;  Surgeon: [name], MD;  Location: [facility];  Service: Orthopedics;  Laterality: Right;   KNEE ARTHROSCOPY WITH MENISCAL REPAIR Right 05/29/2023   Procedure: KNEE ARTHROSCOPY WITH MENISCAL REPAIR LATERAL;  Surgeon: [name], MD;  Location: [facility];  Service: Orthopedics;  Laterality: Right;   NO PAST SURGERIES     Patient Active Problem List   Diagnosis Date Noted   Rupture of anterior cruciate ligament of right knee 05/29/2023   Peripheral tear of lateral meniscus of right knee as current injury 05/29/2023   Seasonal allergies 07/18/2016   Attention and concentration deficit 06/30/2015    REFERRING PROVIDER: Steward Drone, MD  REFERRING DIAG:  S83.511A (ICD-10-CM) - Rupture of anterior cruciate ligament of right knee, initial encounter    Post op 6/25  PROCEDURE: 1.  Right knee anterior cruciate ligament reconstruction with quadriceps tendon autograft. 2.  Right knee lateral meniscal repair  Rationale for Evaluation and Treatment:  Rehabilitation  THERAPY DIAG:  Difficulty in walking, not elsewhere classified  Acute pain of right knee  Muscle weakness (generalized)  ONSET DATE: DOS 05/29/23  Days since surgery: 125  SUBJECTIVE:                                                                                                                                                                                           SUBJECTIVE STATEMENT: Pt states the knee feels good. He played 50% in a 3 on 3 game and felt okay. Pt does not have brace with him today.   PERTINENT HISTORY:  none  PAIN:  Are you having pain? No  PRECAUTIONS:  Knee  WEIGHT BEARING RESTRICTIONS:  Yes POSTOPERATIVE PLAN: He will be weightbearing as tolerated on the right leg with the brace locked in extension.  He begin physical therapy immediately postop.    FALLS:  Has patient fallen in last 6 months? No  OCCUPATION:  student  PLOF:  Independent  PATIENT GOALS:  Back to PLOF  OBJECTIVE:   PATIENT SURVEYS:  LEFS EVAL: 33 LEFS 9/4: 95%  LEFS 10/4: 97%  LOWER EXTREMITY ROM:   Passive ROM Right eval Left eval Right  9/4 10/9  Hip flexion        Hip extension        Hip abduction        Hip adduction        Hip internal rotation        Hip external rotation        Knee flexion    114 passive, 110 active 130  Knee extension    0 0  Ankle dorsiflexion        Ankle plantarflexion        Ankle inversion        Ankle eversion         (Blank rows = not tested)    MMT 9/4: R knee extension: 4/5, R knee flexion: 5/5 9/16: Rt 33.6 lb, Lt 39.1  10/7 R knee ext 5RM 45lbs  L knee ext 5RM 75llbs   Functional Test: 10 yrd sprint [facility]  Sprint to stop: decreased eccentric lowering control; hip strategy   Ybalance test 9/16 Right: fwd-65  Lat- 100  med- 92 Left: fwd- 54 lat- 107 med- 99  TREATMENT:     10/28  5 min track jog   Sprint build up 2x each up with 100% 20yards  SL split jump 4x6  Wide curve run around  turf windown; run 2x3 100%; small arc run with touch and turn 75%  Free throw line box drill 2x4 beach direction     10/16   Elliptical warm up 4 min   Step ups (concrete wall~14inches) 2x10 R Runs 75% speed - 15yds xlaps with focus  on quick deceleration Run 10yds, back pedal 5yds x3laps Broad jumps 10yds x3laps Side shuffling 10yds x3laps  Knee extension 2x10 R 40lbs  Jump down DL to SL from curb (4inch easy, so completed from 6 inch height) 2x10   10/9  5 min track jog   Build up runs 25%-100% 10 yrds (baseline to 3pt or VB line)     Sprint to stop 5x 10 yrds 75% speed Sprint to back peddle 5x 5yards  4x 50% speed Snap downs with ball 2x10 each  (DL and SL) Bounds 10 yards 4x    10/7   Elliptical warm up 6 min   Knee extension 3x5 R 40lbs SL hopping 30s 3x R (lateral hopping)  SLS squat to 18" box 3x10 Nordic HS curls 3x5 Fwd fall to catch 10x     Treatment                            9/16:  Shuttle sidelying press & hops 1*12 Squat hops with focus on smooth transition to squat Squat hops lateral & fwd/back Diver +windmill SLS with ball maneuver around cones lateral and adducted across body   9/4  110deg flexion AROM, 114 passively Updated MMT Updated goals Bike  R knee flexion, tibial posterior glide grade II-III, patella mobilizations Prone quad set 2.5# 5" 2x10 Front plank 15sec x5 (cues for form)  Stairs x1 flight reciprocal Fwd step up 10" aerobic step 2x10 Lateral eccentric heel tap from 8" aerobic step 2x10 Eccentric fwd heel tap from 8" aerobic step 2x10  Eccentric stand to sit to 20inch box (~90deg knee flexion) 2x10  RDL with 6kg KB x10 (cues for form)  8/19  116 flexion  Bike  R knee flexion, tibial posterior glide grade II-III, patella mobilizations Prone quad set 2.5# 5" 2x10 Front plank 15sec x5 (cues for form) Short to tall kneel 2x10   Stairs x1 flight reciprocal Eccentric retro step from 8" aerobic step  2x10 Laterall ccentric heel tap from 8" aerobic step 2x10 Eccentric fwd heel tap from 6" box 2x10  Eccentric stand to sit to 20inch box (!90deg knee flexion), normal stand up   8/114  116 flexion,   2ext   STM to R HS, active release  Prone quad stretch 30s  R knee flexion, tibial posterior glide grade IV  SLS rebounder: fwd, single UE, L and R 20x each Iso lunge hold at wall 3s 3x6-8 Hexbar DL  Basketball SL chest pass, SL single UE pass  20x                                                PATIENT EDUCATION:  Education details: Anatomy of condition, POC, HEP, exercise form/rationale Person educated: Patient and Parent Education method: Explanation, Demonstration, Tactile cues, Verbal cues, and Handouts Education comprehension: verbalized understanding, returned demonstration, verbal cues required, tactile cues required, and needs further education  HOME EXERCISE PROGRAM: [id number]   ASSESSMENT:  CLINICAL IMPRESSION: Pt straight line running progressed to 100% speed with intro to curvilinear running added. Pt with good technique and no aberrant motion at  knee or hip. Pt does still rely more on L during split jumping motions and has low heel recovery height during sprinting motion. Pt HEP updated and pt advised to keep lateral motions or COD to 50-75% speed at most. Straight line can be 100%. Pt to perform with brace on. Pt advised to stop contact basketball activity. Pt would benefit from continued skilled therapy in order to reach goals and maximize functional R LE strength and ROM for full return to PLOF.    REHAB POTENTIAL: Good  CLINICAL DECISION MAKING: Stable/uncomplicated  EVALUATION COMPLEXITY: Low   GOALS: Goals reviewed with patient? Yes  SHORT TERM GOALS: Target date: 7/27  Full avail knee ROM Baseline: 0 ext at eval, locked in ext Goal status: achieved  2.  10 SLR without quad lag, without brace Baseline: unable at eval Goal status: achieved  3.   Proper gait pattern with brace donned Baseline: unable at eval, locked in extension Goal status: achieved    LONG TERM GOALS: Target date: POC date  LEFS to improve to at least 66/80 Baseline: 33 at eval Goal status: MET 9/4  2.  LE MMT 85% of opp LE via hand held dynamometry testing Baseline: will test as appropriate Goal status: INITIAL  3.  Proper form with plyometrics & running Baseline: unable at eval Goal status: INITIAL  4.  Able to navigate balance on unstable surfaces without pain or compensation Baseline: unable at eval Goal status: IN PROGRESS 9/4  5.  Able to navigate stairs without pain or compensation Baseline: unable at eval Goal status: MET 8/19  6. Pt will be able to demonstrate ability to run/sprint/cut without movement deviations in order to demonstrate functional improvement and tolerance to higher plyometric loading.  Goal status: INITIAL  7. Pt will be able to demonstrate 90% QI  in order to demonstrate functional improvement in LE function for return to PLOF.   Goal status: INITIAL   PLAN:  PT FREQUENCY: 1-2x/week  PT DURATION: POC date  PLANNED INTERVENTIONS: Therapeutic exercises, Therapeutic activity, Neuromuscular re-education, Balance training, Gait training, Patient/Family education, Self Care, Joint mobilization, Stair training, Aquatic Therapy, Dry Needling, Electrical stimulation, Spinal mobilization, Cryotherapy, Moist heat, scar mobilization, Taping, Vasopneumatic device, Manual therapy, and Re-evaluation.  PLAN FOR NEXT SESSION: per protocol  Zebedee Iba PT, DPT 10/01/23 11:49 AM      Check all possible CPT codes: 87564 - PT Re-evaluation, 97110- Therapeutic Exercise, [phone]- Neuro Re-education, [phone] - Gait Training, [phone] - Manual Therapy, 97530 - Therapeutic Activities, [phone] - Self Care, [phone] - Electrical stimulation (unattended), [id number] - Vaso, [id number] - Physical performance training, and [phone] - Aquatic therapy    Check all  conditions that are expected to impact treatment: {Conditions expected to impact treatment:None of these apply   If treatment provided at initial evaluation, no treatment charged due to lack of authorization.

## 2023-10-03 ENCOUNTER — Encounter (HOSPITAL_BASED_OUTPATIENT_CLINIC_OR_DEPARTMENT_OTHER): Payer: BC Managed Care – PPO | Admitting: Physical Therapy

## 2023-10-03 ENCOUNTER — Ambulatory Visit (HOSPITAL_BASED_OUTPATIENT_CLINIC_OR_DEPARTMENT_OTHER): Payer: BC Managed Care – PPO | Admitting: Physical Therapy

## 2023-10-15 ENCOUNTER — Ambulatory Visit (HOSPITAL_BASED_OUTPATIENT_CLINIC_OR_DEPARTMENT_OTHER): Payer: Medicaid Other | Attending: Orthopaedic Surgery | Admitting: Physical Therapy

## 2023-10-15 ENCOUNTER — Encounter (HOSPITAL_BASED_OUTPATIENT_CLINIC_OR_DEPARTMENT_OTHER): Payer: Self-pay | Admitting: Physical Therapy

## 2023-10-15 DIAGNOSIS — M6281 Muscle weakness (generalized): Secondary | ICD-10-CM | POA: Insufficient documentation

## 2023-10-15 DIAGNOSIS — R262 Difficulty in walking, not elsewhere classified: Secondary | ICD-10-CM | POA: Insufficient documentation

## 2023-10-15 DIAGNOSIS — M25561 Pain in right knee: Secondary | ICD-10-CM | POA: Insufficient documentation

## 2023-10-22 ENCOUNTER — Encounter (HOSPITAL_BASED_OUTPATIENT_CLINIC_OR_DEPARTMENT_OTHER): Payer: Self-pay | Admitting: Physical Therapy

## 2023-10-26 ENCOUNTER — Ambulatory Visit (HOSPITAL_BASED_OUTPATIENT_CLINIC_OR_DEPARTMENT_OTHER): Payer: Medicaid Other | Admitting: Physical Therapy

## 2023-11-12 ENCOUNTER — Ambulatory Visit (HOSPITAL_BASED_OUTPATIENT_CLINIC_OR_DEPARTMENT_OTHER): Payer: Medicaid Other | Attending: Orthopaedic Surgery | Admitting: Physical Therapy

## 2023-11-14 ENCOUNTER — Ambulatory Visit (HOSPITAL_BASED_OUTPATIENT_CLINIC_OR_DEPARTMENT_OTHER): Payer: Medicaid Other | Attending: Orthopaedic Surgery

## 2023-11-14 ENCOUNTER — Encounter (HOSPITAL_BASED_OUTPATIENT_CLINIC_OR_DEPARTMENT_OTHER): Payer: Self-pay

## 2023-11-14 DIAGNOSIS — R262 Difficulty in walking, not elsewhere classified: Secondary | ICD-10-CM | POA: Insufficient documentation

## 2023-11-14 DIAGNOSIS — M25561 Pain in right knee: Secondary | ICD-10-CM | POA: Diagnosis not present

## 2023-11-14 DIAGNOSIS — M6281 Muscle weakness (generalized): Secondary | ICD-10-CM | POA: Diagnosis not present

## 2023-11-14 NOTE — Therapy (Signed)
OUTPATIENT PHYSICAL THERAPY TREATMENT     Patient Name: Nathaniel Johnson MRN: [id number] DOB:02-26-2006, 17 y.o., male Today's Date: 11/14/2023  END OF SESSION:  PT End of Session - 11/14/23 1038     Visit Number 17    Number of Visits 27    Date for PT Re-Evaluation 12/11/23    Authorization Type [employer]    PT Start Time 1020    PT Stop Time 1100    PT Time Calculation (min) 40 min    Activity Tolerance Patient tolerated treatment well    Behavior During Therapy WFL for tasks assessed/performed                    Past Medical History:  Diagnosis Date   Gynecomastia 01/01/2017   Seasonal allergies    Torn ACL    Past Surgical History:  Procedure Laterality Date   KNEE ARTHROSCOPY WITH ANTERIOR CRUCIATE LIGAMENT (ACL) REPAIR WITH HAMSTRING GRAFT Right 05/29/2023   Procedure: RIGHT KNEE ARTHROSCOPY WITH ANTERIOR CRUCIATE LIGAMENT (ACL) RECONSTRUCTION WITH QUADRICEPS AUTOGRAFT;  Surgeon: [name], MD;  Location: [facility];  Service: Orthopedics;  Laterality: Right;   KNEE ARTHROSCOPY WITH MENISCAL REPAIR Right 05/29/2023   Procedure: KNEE ARTHROSCOPY WITH MENISCAL REPAIR LATERAL;  Surgeon: [name], MD;  Location: [facility];  Service: Orthopedics;  Laterality: Right;   NO PAST SURGERIES     Patient Active Problem List   Diagnosis Date Noted   Rupture of anterior cruciate ligament of right knee 05/29/2023   Peripheral tear of lateral meniscus of right knee as current injury 05/29/2023   Seasonal allergies 07/18/2016   Attention and concentration deficit 06/30/2015    REFERRING PROVIDER: Steward Drone, MD  REFERRING DIAG:  S83.511A (ICD-10-CM) - Rupture of anterior cruciate ligament of right knee, initial encounter    Post op 6/25  PROCEDURE: 1.  Right knee anterior cruciate ligament reconstruction with quadriceps tendon autograft. 2.  Right knee lateral meniscal repair  Rationale for Evaluation and Treatment:  Rehabilitation  THERAPY DIAG:  Difficulty in walking, not elsewhere classified  Acute pain of right knee  Muscle weakness (generalized)  ONSET DATE: DOS 05/29/23  Days since surgery: 169  SUBJECTIVE:                                                                                                                                                                                           SUBJECTIVE STATEMENT: Pt denies pain at entry. Arrives without brace. Has not attended PT since October, but has been active. Plays basketball with brace donned.   PERTINENT HISTORY:  none  PAIN:  Are you having pain? No  PRECAUTIONS:  Knee  WEIGHT BEARING RESTRICTIONS:  Yes POSTOPERATIVE PLAN: He will be weightbearing as tolerated on the right leg with the brace locked in extension.  He begin physical therapy immediately postop.    FALLS:  Has patient fallen in last 6 months? No  OCCUPATION:  student  PLOF:  Independent  PATIENT GOALS:  Back to PLOF  OBJECTIVE:   PATIENT SURVEYS:  LEFS EVAL: 33 LEFS 9/4: 95%  LEFS 10/4: 97%  LOWER EXTREMITY ROM:   Passive ROM Right eval Left eval Right  9/4 10/9  Hip flexion        Hip extension        Hip abduction        Hip adduction        Hip internal rotation        Hip external rotation        Knee flexion    114 passive, 110 active 130  Knee extension    0 0  Ankle dorsiflexion        Ankle plantarflexion        Ankle inversion        Ankle eversion         (Blank rows = not tested)    MMT 9/4: R knee extension: 4/5, R knee flexion: 5/5 9/16: Rt 33.6 lb, Lt 39.1     10/7 R knee ext 5RM 45lbs  L knee ext 5RM 75llbs   Functional Test: 10 yrd sprint [facility]  Sprint to stop: decreased eccentric lowering control; hip strategy   Ybalance test 9/16 Right: fwd-65  Lat- 100  med- 92 Left: fwd- 54 lat- 107 med- 99  TREATMENT:       12/11 Elliptical warm up L5 Track upstairs dynamic cwarm up- ballerinas, high  knees, butt kicks, back pedaling Fwd.back hopping 2x15 Lateral hopping 2x15 Squat jumps x10 Single leg hops x10 Broad jumps double to single leg (cues to land on ball of foot) Jump/step off 18inch plyoblock, land single leg Runner's lunge with heel raise 4" aerobic step HSC with physioball 2x10 Seated SLR 2x10  10/28  5 min track jog   Sprint build up 2x each up with 100% 20yards  SL split jump 4x6  Wide curve run around turf windown; run 2x3 100%; small arc run with touch and turn 75%  Free throw line box drill 2x4 beach direction     10/16   Elliptical warm up 4 min   Step ups (concrete wall~14inches) 2x10 R Runs 75% speed - 15yds xlaps with focus  on quick deceleration Run 10yds, back pedal 5yds x3laps Broad jumps 10yds x3laps Side shuffling 10yds x3laps  Knee extension 2x10 R 40lbs  Jump down DL to SL from curb (4inch easy, so completed from 6 inch height) 2x10       Treatment                            9/16:  Shuttle sidelying press & hops 1*12 Squat hops with focus on smooth transition to squat Squat hops lateral & fwd/back Diver +windmill SLS with ball maneuver around cones lateral and adducted across body  PATIENT EDUCATION:  Education details: Teacher, music of condition, POC, HEP, exercise form/rationale Person educated: Patient and Parent Education method: Explanation, Demonstration, Tactile cues, Verbal cues, and Handouts Education comprehension: verbalized understanding, returned demonstration, verbal cues required, tactile cues required, and needs further education  HOME EXERCISE PROGRAM: [email or web address]   ASSESSMENT:  CLINICAL IMPRESSION: Able to advance with dynamic and plyometric activities today, working on single leg stability. No pain noted with exercises, though mild instability observed with R LE compared to L with landing/deceleration. He required cues with jumping tasks to land on ball of foot. Pt  to continue wearing brace with sporting activities until strength and stability is comparable to contralateral LE. He has f/u with MD on 12/18.    REHAB POTENTIAL: Good  CLINICAL DECISION MAKING: Stable/uncomplicated  EVALUATION COMPLEXITY: Low   GOALS: Goals reviewed with patient? Yes  SHORT TERM GOALS: Target date: 7/27  Full avail knee ROM Baseline: 0 ext at eval, locked in ext Goal status: achieved  2.  10 SLR without quad lag, without brace Baseline: unable at eval Goal status: achieved  3.  Proper gait pattern with brace donned Baseline: unable at eval, locked in extension Goal status: achieved    LONG TERM GOALS: Target date: POC date  LEFS to improve to at least 66/80 Baseline: 33 at eval Goal status: MET 9/4  2.  LE MMT 85% of opp LE via hand held dynamometry testing Baseline: will test as appropriate Goal status: IN PROGRESS  3.  Proper form with plyometrics & running Baseline: unable at eval Goal status: IN PROGRESS  4.  Able to navigate balance on unstable surfaces without pain or compensation Baseline: unable at eval Goal status: IN PROGRESS 9/4  5.  Able to navigate stairs without pain or compensation Baseline: unable at eval Goal status: MET 8/19  6. Pt will be able to demonstrate ability to run/sprint/cut without movement deviations in order to demonstrate functional improvement and tolerance to higher plyometric loading.  Goal status:IN PROGRESS  7. Pt will be able to demonstrate 90% QI  in order to demonstrate functional improvement in LE function for return to PLOF.   Goal status: INITIAL   PLAN:  PT FREQUENCY: 1-2x/week  PT DURATION: POC date  PLANNED INTERVENTIONS: Therapeutic exercises, Therapeutic activity, Neuromuscular re-education, Balance training, Gait training, Patient/Family education, Self Care, Joint mobilization, Stair training, Aquatic Therapy, Dry Needling, Electrical stimulation, Spinal mobilization, Cryotherapy,  Moist heat, scar mobilization, Taping, Vasopneumatic device, Manual therapy, and Re-evaluation.  PLAN FOR NEXT SESSION: per protocol  Riki Altes, PTA  11/14/23 11:02 AM      Check all possible CPT codes: 78295 - PT Re-evaluation, 97110- Therapeutic Exercise, [phone]- Neuro Re-education, [phone] - Gait Training, [phone] - Manual Therapy, 97530 - Therapeutic Activities, [phone] - Self Care, [phone] - Electrical stimulation (unattended), [id number] - Vaso, [id number] - Physical performance training, and [phone] - Aquatic therapy    Check all conditions that are expected to impact treatment: {Conditions expected to impact treatment:None of these apply   If treatment provided at initial evaluation, no treatment charged due to lack of authorization.

## 2023-11-21 ENCOUNTER — Ambulatory Visit (INDEPENDENT_AMBULATORY_CARE_PROVIDER_SITE_OTHER): Payer: BC Managed Care – PPO | Admitting: Orthopaedic Surgery

## 2023-11-21 DIAGNOSIS — S83511A Sprain of anterior cruciate ligament of right knee, initial encounter: Secondary | ICD-10-CM | POA: Diagnosis not present

## 2023-11-21 NOTE — Progress Notes (Signed)
Post Operative Evaluation    Procedure/Date of Surgery: Status post right ACL reconstruction 6/25  Interval History:    Presents today 6 months status post above procedure.  Overall he is doing extremely well.  At this time he is gone back to gentle basketball and does have up-and-down the core conditioning without any competitive playing  PMH/PSH/Family History/Social History/Meds/Allergies:    Past Medical History:  Diagnosis Date   Gynecomastia 01/01/2017   Seasonal allergies    Torn ACL    Past Surgical History:  Procedure Laterality Date   KNEE ARTHROSCOPY WITH ANTERIOR CRUCIATE LIGAMENT (ACL) REPAIR WITH HAMSTRING GRAFT Right 05/29/2023   Procedure: RIGHT KNEE ARTHROSCOPY WITH ANTERIOR CRUCIATE LIGAMENT (ACL) RECONSTRUCTION WITH QUADRICEPS AUTOGRAFT;  Surgeon: [name], MD;  Location: [facility];  Service: Orthopedics;  Laterality: Right;   KNEE ARTHROSCOPY WITH MENISCAL REPAIR Right 05/29/2023   Procedure: KNEE ARTHROSCOPY WITH MENISCAL REPAIR LATERAL;  Surgeon: [name], MD;  Location: [facility];  Service: Orthopedics;  Laterality: Right;   NO PAST SURGERIES     Social History   Socioeconomic History   Marital status: Single    Spouse name: Not on file   Number of children: Not on file   Years of education: Not on file   Highest education level: Not on file  Occupational History   Not on file  Tobacco Use   Smoking status: Never   Smokeless tobacco: Never  Vaping Use   Vaping status: Never Used  Substance and Sexual Activity   Alcohol use: No   Drug use: Yes    Types: Marijuana    Comment: occas   Sexual activity: Not on file  Other Topics Concern   Not on file  Social History Narrative   Not on file   Social Drivers of Health   Financial Resource Strain: Not on file  Food Insecurity: Not on file  Transportation Needs: Not on file  Physical Activity: Not on file  Stress:  Not on file  Social Connections: Not on file   Family History  Problem Relation Age of Onset   Hypertension Mother    Allergies  Allergen Reactions   Pollen Extract     hives   Current Outpatient Medications  Medication Sig Dispense Refill   aspirin EC 325 MG tablet Take 1 tablet (325 mg total) by mouth daily. 30 tablet 0   levocetirizine (XYZAL) 5 MG tablet Take 5 mg by mouth every evening.     oxyCODONE (ROXICODONE) 5 MG immediate release tablet Take 1 tablet (5 mg total) by mouth every 4 (four) hours as needed for severe pain or breakthrough pain. 10 tablet 0   No current facility-administered medications for this visit.   No results found.  Review of Systems:   A ROS was performed including pertinent positives and negatives as documented in the HPI.   Musculoskeletal Exam:    There were no vitals taken for this visit.  Right knee incisions are well-appearing without erythema or drainage.  Range of motion is from -3 - 135 degrees without pain.  Improved quadriceps atrophy with improved bulk, no effusion.  Negative Lachman  Imaging:      I personally reviewed and interpreted the radiographs.   Assessment:   6 months status post right knee ACL reconstruction  with quadriceps tendon autograft.  Overall he is doing extremely well.  At this time I have asked him to refrain from any cutting or pivoting in formal sports or pickup games until his strength has normalized.  He will begin the strength testing at the 7 to 9-month mark.  I will plan to see him back in 3 months for reassessment  Plan :    -Return to clinic 12 weeks      I personally saw and evaluated the patient, and participated in the management and treatment plan.  [name], MD Attending Physician, Orthopedic Surgery  This document was dictated using Dragon voice recognition software. A reasonable attempt at proof reading has been made to minimize errors.

## 2023-11-22 ENCOUNTER — Ambulatory Visit (HOSPITAL_BASED_OUTPATIENT_CLINIC_OR_DEPARTMENT_OTHER): Payer: Medicaid Other

## 2023-11-22 ENCOUNTER — Encounter (HOSPITAL_BASED_OUTPATIENT_CLINIC_OR_DEPARTMENT_OTHER): Payer: Self-pay

## 2023-11-22 DIAGNOSIS — R262 Difficulty in walking, not elsewhere classified: Secondary | ICD-10-CM | POA: Diagnosis not present

## 2023-11-22 DIAGNOSIS — M6281 Muscle weakness (generalized): Secondary | ICD-10-CM | POA: Diagnosis not present

## 2023-11-22 DIAGNOSIS — M25561 Pain in right knee: Secondary | ICD-10-CM | POA: Diagnosis not present

## 2023-11-22 NOTE — Therapy (Signed)
OUTPATIENT PHYSICAL THERAPY TREATMENT     Patient Name: Nathaniel Johnson MRN: [id number] DOB:November 10, 2006, 17 y.o., male Today's Date: 11/22/2023  END OF SESSION:  PT End of Session - 11/22/23 0842     Visit Number 18    Number of Visits 27    Date for PT Re-Evaluation 12/11/23    Authorization Type [employer]    PT Start Time [phone]    PT Stop Time 0933    PT Time Calculation (min) 49 min    Activity Tolerance Patient tolerated treatment well    Behavior During Therapy [facility] for tasks assessed/performed                    Past Medical History:  Diagnosis Date   Gynecomastia 01/01/2017   Seasonal allergies    Torn ACL    Past Surgical History:  Procedure Laterality Date   KNEE ARTHROSCOPY WITH ANTERIOR CRUCIATE LIGAMENT (ACL) REPAIR WITH HAMSTRING GRAFT Right 05/29/2023   Procedure: RIGHT KNEE ARTHROSCOPY WITH ANTERIOR CRUCIATE LIGAMENT (ACL) RECONSTRUCTION WITH QUADRICEPS AUTOGRAFT;  Surgeon: [name], MD;  Location: [facility];  Service: Orthopedics;  Laterality: Right;   KNEE ARTHROSCOPY WITH MENISCAL REPAIR Right 05/29/2023   Procedure: KNEE ARTHROSCOPY WITH MENISCAL REPAIR LATERAL;  Surgeon: [name], MD;  [facility];  Service: Orthopedics;  Laterality: Right;   NO PAST SURGERIES     Patient Active Problem List   Diagnosis Date Noted   Rupture of anterior cruciate ligament of right knee 05/29/2023   Peripheral tear of lateral meniscus of right knee as current injury 05/29/2023   Seasonal allergies 07/18/2016   Attention and concentration deficit 06/30/2015    REFERRING PROVIDER: Steward Drone, MD  REFERRING DIAG:  S83.511A (ICD-10-CM) - Rupture of anterior cruciate ligament of right knee, initial encounter    Post op 6/25  PROCEDURE: 1.  Right knee anterior cruciate ligament reconstruction with quadriceps tendon autograft. 2.  Right knee lateral meniscal repair  Rationale for Evaluation and Treatment:  Rehabilitation  THERAPY DIAG:  Acute pain of right knee  Muscle weakness (generalized)  Difficulty in walking, not elsewhere classified  ONSET DATE: DOS 05/29/23  Days since surgery: 177  SUBJECTIVE:                                                                                                                                                                                           SUBJECTIVE STATEMENT: Pt reports no complaints. Arrives without brace donned. Saw MD who told him to avoid cutting and pivoting with basketball. Can start quad testing in a few weeks.  PERTINENT HISTORY:  none  PAIN:  Are you having pain? No  PRECAUTIONS:  Knee  WEIGHT BEARING RESTRICTIONS:  Yes POSTOPERATIVE PLAN: He will be weightbearing as tolerated on the right leg with the brace locked in extension.  He begin physical therapy immediately postop.    FALLS:  Has patient fallen in last 6 months? No  OCCUPATION:  student  PLOF:  Independent  PATIENT GOALS:  Back to PLOF  OBJECTIVE:   PATIENT SURVEYS:  LEFS EVAL: 33 LEFS 9/4: 95%  LEFS 10/4: 97%  LOWER EXTREMITY ROM:   Passive ROM Right eval Left eval Right  9/4 10/9  Hip flexion        Hip extension        Hip abduction        Hip adduction        Hip internal rotation        Hip external rotation        Knee flexion    114 passive, 110 active 130  Knee extension    0 0  Ankle dorsiflexion        Ankle plantarflexion        Ankle inversion        Ankle eversion         (Blank rows = not tested)    MMT 9/4: R knee extension: 4/5, R knee flexion: 5/5 9/16: Rt 33.6 lb, Lt 39.1     10/7 R knee ext 5RM 45lbs  L knee ext 5RM 75llbs   Functional Test: 10 yrd sprint [facility]  Sprint to stop: decreased eccentric lowering control; hip strategy   Ybalance test 9/16 Right: fwd-65  Lat- 100  med- 92 Left: fwd- 54 lat- 107 med- 99  TREATMENT:      12/19 Elliptical warm up L5 Length of basketball court:  dynamic warm up- jogging ballerinas, high knees, butt kicks, back pedaling, side shuffling  Standing quad stretch-3x30seconds 4 corner hops x5 ea direction Sprint to sudden deceleration on RLE Jog a few paces then deceleration on R LE Single leg lateral squat jumps Broad jumps half court x2 Wall sit with L LE lift 2x10 Squat on toes x5 then single jump with land on toes x5 rounds Broad jumps double to single leg (cues to land on ball of foot) Jump/step off 18inch plyoblock upstairs, land single leg x10 HSC with physioball 2x15 Seated SLR 2x10 over midline   12/11 Elliptical warm up L5 Track upstairs dynamic warm up- ballerinas, high knees, butt kicks, back pedaling Fwd.back hopping 2x15 Lateral hopping 2x15 Squat jumps x10 Single leg hops x10 Broad jumps double to single leg (cues to land on ball of foot) Jump/step off 18inch plyoblock, land single leg Runner's lunge with heel raise 4" aerobic step HSC with physioball 2x10 Seated SLR 2x10  10/28  5 min track jog   Sprint build up 2x each up with 100% 20yards  SL split jump 4x6  Wide curve run around turf windown; run 2x3 100%; small arc run with touch and turn 75%  Free throw line box drill 2x4 beach direction     10/16   Elliptical warm up 4 min   Step ups (concrete wall~14inches) 2x10 R Runs 75% speed - 15yds xlaps with focus  on quick deceleration Run 10yds, back pedal 5yds x3laps Broad jumps 10yds x3laps Side shuffling 10yds x3laps  Knee extension 2x10 R 40lbs  Jump down DL to SL from curb (4inch easy, so completed from 6  inch height) 2x10       Treatment                            9/16:  Shuttle sidelying press & hops 1*12 Squat hops with focus on smooth transition to squat Squat hops lateral & fwd/back Diver +windmill SLS with ball maneuver around cones lateral and adducted across body                                                PATIENT EDUCATION:  Education details: Teacher, music of  condition, POC, HEP, exercise form/rationale Person educated: Patient and Parent Education method: Explanation, Demonstration, Tactile cues, Verbal cues, and Handouts Education comprehension: verbalized understanding, returned demonstration, verbal cues required, tactile cues required, and needs further education  HOME EXERCISE PROGRAM: [email or web address]   ASSESSMENT:  CLINICAL IMPRESSION: Pt continues to progress well with dynamic and plyometric activity. Worked on deceleration technique today with pt showing apprehension when performing from a sprint pace. This improved with slower pace. Pt requires cues with jumping tasks to maintain bal of foot position and quad engagement. Pt did report LE fatigue by end of session, though denied any discomfort. Will continue to progress NMC, stability, strength, and endurance.     REHAB POTENTIAL: Good  CLINICAL DECISION MAKING: Stable/uncomplicated  EVALUATION COMPLEXITY: Low   GOALS: Goals reviewed with patient? Yes  SHORT TERM GOALS: Target date: 7/27  Full avail knee ROM Baseline: 0 ext at eval, locked in ext Goal status: achieved  2.  10 SLR without quad lag, without brace Baseline: unable at eval Goal status: achieved  3.  Proper gait pattern with brace donned Baseline: unable at eval, locked in extension Goal status: achieved    LONG TERM GOALS: Target date: POC date  LEFS to improve to at least 66/80 Baseline: 33 at eval Goal status: MET 9/4  2.  LE MMT 85% of opp LE via hand held dynamometry testing Baseline: will test as appropriate Goal status: IN PROGRESS  3.  Proper form with plyometrics & running Baseline: unable at eval Goal status: IN PROGRESS  4.  Able to navigate balance on unstable surfaces without pain or compensation Baseline: unable at eval Goal status: IN PROGRESS 9/4  5.  Able to navigate stairs without pain or compensation Baseline: unable at eval Goal status: MET 8/19  6. Pt will be able to  demonstrate ability to run/sprint/cut without movement deviations in order to demonstrate functional improvement and tolerance to higher plyometric loading.  Goal status:IN PROGRESS  7. Pt will be able to demonstrate 90% QI  in order to demonstrate functional improvement in LE function for return to PLOF.   Goal status: INITIAL   PLAN:  PT FREQUENCY: 1-2x/week  PT DURATION: POC date  PLANNED INTERVENTIONS: Therapeutic exercises, Therapeutic activity, Neuromuscular re-education, Balance training, Gait training, Patient/Family education, Self Care, Joint mobilization, Stair training, Aquatic Therapy, Dry Needling, Electrical stimulation, Spinal mobilization, Cryotherapy, Moist heat, scar mobilization, Taping, Vasopneumatic device, Manual therapy, and Re-evaluation.  PLAN FOR NEXT SESSION: per protocol  Riki Altes, PTA  11/22/23 10:02 AM      Check all possible CPT codes: 13086 - PT Re-evaluation, 97110- Therapeutic Exercise, [phone]- Neuro Re-education, [phone] - Gait Training, [phone] - Manual Therapy, 97530 - Therapeutic Activities, 97535 - Self Care, [phone] - Electrical stimulation (  unattended), 97016 - Vaso, [phone] - Physical performance training, and 29562 - Aquatic therapy    Check all conditions that are expected to impact treatment: {Conditions expected to impact treatment:None of these apply   If treatment provided at initial evaluation, no treatment charged due to lack of authorization.

## 2023-12-03 ENCOUNTER — Ambulatory Visit (HOSPITAL_BASED_OUTPATIENT_CLINIC_OR_DEPARTMENT_OTHER): Payer: Medicaid Other | Admitting: Physical Therapy

## 2023-12-04 ENCOUNTER — Ambulatory Visit (HOSPITAL_BASED_OUTPATIENT_CLINIC_OR_DEPARTMENT_OTHER): Payer: Medicaid Other

## 2023-12-04 ENCOUNTER — Encounter (HOSPITAL_BASED_OUTPATIENT_CLINIC_OR_DEPARTMENT_OTHER): Payer: Self-pay

## 2023-12-04 DIAGNOSIS — M6281 Muscle weakness (generalized): Secondary | ICD-10-CM | POA: Diagnosis not present

## 2023-12-04 DIAGNOSIS — M25561 Pain in right knee: Secondary | ICD-10-CM | POA: Diagnosis not present

## 2023-12-04 DIAGNOSIS — R262 Difficulty in walking, not elsewhere classified: Secondary | ICD-10-CM

## 2023-12-04 NOTE — Therapy (Signed)
 OUTPATIENT PHYSICAL THERAPY TREATMENT     Patient Name: Nathaniel Johnson MRN: [id number] DOB:02-14-2006, 17 y.o., male Today's Date: 12/04/2023  END OF SESSION:  PT End of Session - 12/04/23 1109     Visit Number 19    Number of Visits 27    Date for PT Re-Evaluation 12/11/23    Authorization Type [employer]    PT Start Time 1105    PT Stop Time 1145    PT Time Calculation (min) 40 min    Activity Tolerance Patient tolerated treatment well    Behavior During Therapy [facility] for tasks assessed/performed                    Past Medical History:  Diagnosis Date   Gynecomastia 01/01/2017   Seasonal allergies    Torn ACL    Past Surgical History:  Procedure Laterality Date   KNEE ARTHROSCOPY WITH ANTERIOR CRUCIATE LIGAMENT (ACL) REPAIR WITH HAMSTRING GRAFT Right 05/29/2023   Procedure: RIGHT KNEE ARTHROSCOPY WITH ANTERIOR CRUCIATE LIGAMENT (ACL) RECONSTRUCTION WITH QUADRICEPS AUTOGRAFT;  Surgeon: Genelle Standing, MD;  Location: [facility];  Service: Orthopedics;  Laterality: Right;   KNEE ARTHROSCOPY WITH MENISCAL REPAIR Right 05/29/2023   Procedure: KNEE ARTHROSCOPY WITH MENISCAL REPAIR LATERAL;  Surgeon: Genelle Standing, MD;  Location: [facility];  Service: Orthopedics;  Laterality: Right;   NO PAST SURGERIES     Patient Active Problem List   Diagnosis Date Noted   Rupture of anterior cruciate ligament of right knee 05/29/2023   Peripheral tear of lateral meniscus of right knee as current injury 05/29/2023   Seasonal allergies 07/18/2016   Attention and concentration deficit 06/30/2015    REFERRING PROVIDER: Genelle, MD  REFERRING DIAG:  S83.511A (ICD-10-CM) - Rupture of anterior cruciate ligament of right knee, initial encounter    Post op 6/25  PROCEDURE: 1.  Right knee anterior cruciate ligament reconstruction with quadriceps tendon autograft. 2.  Right knee lateral meniscal repair  Rationale for Evaluation and Treatment:  Rehabilitation  THERAPY DIAG:  Acute pain of right knee  Muscle weakness (generalized)  Difficulty in walking, not elsewhere classified  ONSET DATE: DOS 05/29/23  Days since surgery: 189  SUBJECTIVE:                                                                                                                                                                                           SUBJECTIVE STATEMENT: Pt reports no pain in knee. Played basketball at the gym yesteray with knee brace donned. It helps me a lot.   PERTINENT HISTORY:  none  PAIN:  Are you having pain? No  PRECAUTIONS:  Knee  WEIGHT BEARING RESTRICTIONS:  Yes POSTOPERATIVE PLAN: He will be weightbearing as tolerated on the right leg with the brace locked in extension.  He begin physical therapy immediately postop.    FALLS:  Has patient fallen in last 6 months? No  OCCUPATION:  student  PLOF:  Independent  PATIENT GOALS:  Back to PLOF  OBJECTIVE:   PATIENT SURVEYS:  LEFS EVAL: 33 LEFS 9/4: 95%  LEFS 10/4: 97%  LOWER EXTREMITY ROM:   Passive ROM Right eval Left eval Right  9/4 10/9  Hip flexion        Hip extension        Hip abduction        Hip adduction        Hip internal rotation        Hip external rotation        Knee flexion    114 passive, 110 active 130  Knee extension    0 0  Ankle dorsiflexion        Ankle plantarflexion        Ankle inversion        Ankle eversion         (Blank rows = not tested)    MMT 9/4: R knee extension: 4/5, R knee flexion: 5/5 9/16: Rt 33.6 lb, Lt 39.1     10/7 R knee ext 5RM 45lbs  L knee ext 5RM 75llbs   Functional Test: 10 yrd sprint [facility]  Sprint to stop: decreased eccentric lowering control; hip strategy   Ybalance test 9/16 Right: fwd-65  Lat- 100  med- 92 Left: fwd- 54 lat- 107 med- 99  TREATMENT:      12/31 Elliptical warm up L8 ( fast, 1 min slower) Outside on grass: dynamic warm up- jogging ballerinas,  high knees, butt kicks, back pedaling, side shuffling  Standing quad stretch-3x30seconds R Squats x20 4 corner hops x5 ea direction Jog a few paces then deceleration on R LE (slow then faster) Single leg lateral squat jumps 4x5R Wall sit with L LE lift 4x5  Jump/step off cement wall outdoors and land single leg x10R Seated SLR 4x5 over yoga block  12/19 Elliptical warm up L5 Length of basketball court: dynamic warm up- jogging ballerinas, high knees, butt kicks, back pedaling, side shuffling  Standing quad stretch-3x30seconds 4 corner hops x5 ea direction Sprint to sudden deceleration on RLE Jog a few paces then deceleration on R LE Single leg lateral squat jumps Broad jumps half court x2 Wall sit with L LE lift 2x10 Squat on toes x5 then single jump with land on toes x5 rounds Broad jumps double to single leg (cues to land on ball of foot) Jump/step off 18inch plyoblock upstairs, land single leg x10 HSC with physioball 2x15 Seated SLR 2x10 over midline   12/11 Elliptical warm up L5 Track upstairs dynamic warm up- ballerinas, high knees, butt kicks, back pedaling Fwd.back hopping 2x15 Lateral hopping 2x15 Squat jumps x10 Single leg hops x10 Broad jumps double to single leg (cues to land on ball of foot) Jump/step off 18inch plyoblock, land single leg Runner's lunge with heel raise 4 aerobic step HSC with physioball 2x10 Seated SLR 2x10  10/28  5 min track jog   Sprint build up 2x each up with 100% 20yards  SL split jump 4x6  Wide curve run around turf windown; run 2x3 100%; small arc run with touch and turn  75%  Free throw line box drill 2x4 beach direction                       PATIENT EDUCATION:  Education details: Anatomy of condition, POC, HEP, exercise form/rationale Person educated: Patient and Parent Education method: Explanation, Demonstration, Tactile cues, Verbal cues, and Handouts Education comprehension: verbalized understanding,  returned demonstration, verbal cues required, tactile cues required, and needs further education  HOME EXERCISE PROGRAM: [id number]   ASSESSMENT:  CLINICAL IMPRESSION: Patient continues to be challenged by single-leg plyometric based activity.  No pain reported with activity, though he does report mild instability when compared to contralateral limb.  Continue to work on deceleration landing on right foot as he continues to be mildly challenged by this.     REHAB POTENTIAL: Good  CLINICAL DECISION MAKING: Stable/uncomplicated  EVALUATION COMPLEXITY: Low   GOALS: Goals reviewed with patient? Yes  SHORT TERM GOALS: Target date: 7/27  Full avail knee ROM Baseline: 0 ext at eval, locked in ext Goal status: achieved  2.  10 SLR without quad lag, without brace Baseline: unable at eval Goal status: achieved  3.  Proper gait pattern with brace donned Baseline: unable at eval, locked in extension Goal status: achieved    LONG TERM GOALS: Target date: POC date  LEFS to improve to at least 66/80 Baseline: 33 at eval Goal status: MET 9/4  2.  LE MMT 85% of opp LE via hand held dynamometry testing Baseline: will test as appropriate Goal status: IN PROGRESS  3.  Proper form with plyometrics & running Baseline: unable at eval Goal status: IN PROGRESS  4.  Able to navigate balance on unstable surfaces without pain or compensation Baseline: unable at eval Goal status: IN PROGRESS 9/4  5.  Able to navigate stairs without pain or compensation Baseline: unable at eval Goal status: MET 8/19  6. Pt will be able to demonstrate ability to run/sprint/cut without movement deviations in order to demonstrate functional improvement and tolerance to higher plyometric loading.  Goal status:IN PROGRESS  7. Pt will be able to demonstrate 90% QI  in order to demonstrate functional improvement in LE function for return to PLOF.   Goal status: INITIAL   PLAN:  PT FREQUENCY:  1-2x/week  PT DURATION: POC date  PLANNED INTERVENTIONS: Therapeutic exercises, Therapeutic activity, Neuromuscular re-education, Balance training, Gait training, Patient/Family education, Self Care, Joint mobilization, Stair training, Aquatic Therapy, Dry Needling, Electrical stimulation, Spinal mobilization, Cryotherapy, Moist heat, scar mobilization, Taping, Vasopneumatic device, Manual therapy, and Re-evaluation.  PLAN FOR NEXT SESSION: per protocol  Asberry Rodes, PTA  12/04/23 12:25 PM      Check all possible CPT codes: 02835 - PT Re-evaluation, 97110- Therapeutic Exercise, [phone]- Neuro Re-education, [phone] - Gait Training, [phone] - Manual Therapy, 97530 - Therapeutic Activities, [phone] - Self Care, [phone] - Electrical stimulation (unattended), [id number] - Vaso, [id number] - Physical performance training, and [phone] - Aquatic therapy    Check all conditions that are expected to impact treatment: {Conditions expected to impact treatment:None of these apply   If treatment provided at initial evaluation, no treatment charged due to lack of authorization.

## 2023-12-12 ENCOUNTER — Ambulatory Visit (HOSPITAL_BASED_OUTPATIENT_CLINIC_OR_DEPARTMENT_OTHER): Payer: BC Managed Care – PPO

## 2023-12-13 ENCOUNTER — Encounter (HOSPITAL_BASED_OUTPATIENT_CLINIC_OR_DEPARTMENT_OTHER): Payer: Self-pay | Admitting: Physical Therapy

## 2023-12-13 ENCOUNTER — Ambulatory Visit (HOSPITAL_BASED_OUTPATIENT_CLINIC_OR_DEPARTMENT_OTHER): Payer: BC Managed Care – PPO | Attending: Orthopaedic Surgery | Admitting: Physical Therapy

## 2023-12-13 DIAGNOSIS — R262 Difficulty in walking, not elsewhere classified: Secondary | ICD-10-CM | POA: Diagnosis not present

## 2023-12-13 DIAGNOSIS — M25561 Pain in right knee: Secondary | ICD-10-CM | POA: Insufficient documentation

## 2023-12-13 DIAGNOSIS — M6281 Muscle weakness (generalized): Secondary | ICD-10-CM | POA: Insufficient documentation

## 2023-12-13 NOTE — Therapy (Signed)
 OUTPATIENT PHYSICAL THERAPY TREATMENT     Patient Name: Nathaniel Johnson MRN: [id number] DOB:04/10/2006, 18 y.o., male Today's Date: 12/13/2023  END OF SESSION:  PT End of Session - 12/13/23 1023     Visit Number 20    Number of Visits 27    Date for PT Re-Evaluation 02/07/24    Authorization Type [employer]    PT Start Time 1020    PT Stop Time 1100    PT Time Calculation (min) 40 min    Activity Tolerance Patient tolerated treatment well    Behavior During Therapy [facility] for tasks assessed/performed                     Past Medical History:  Diagnosis Date   Gynecomastia 01/01/2017   Seasonal allergies    Torn ACL    Past Surgical History:  Procedure Laterality Date   KNEE ARTHROSCOPY WITH ANTERIOR CRUCIATE LIGAMENT (ACL) REPAIR WITH HAMSTRING GRAFT Right 05/29/2023   Procedure: RIGHT KNEE ARTHROSCOPY WITH ANTERIOR CRUCIATE LIGAMENT (ACL) RECONSTRUCTION WITH QUADRICEPS AUTOGRAFT;  Surgeon: Genelle Standing, MD;  Location: [facility];  Service: Orthopedics;  Laterality: Right;   KNEE ARTHROSCOPY WITH MENISCAL REPAIR Right 05/29/2023   Procedure: KNEE ARTHROSCOPY WITH MENISCAL REPAIR LATERAL;  Surgeon: Genelle Standing, MD;  Location: [facility];  Service: Orthopedics;  Laterality: Right;   NO PAST SURGERIES     Patient Active Problem List   Diagnosis Date Noted   Rupture of anterior cruciate ligament of right knee 05/29/2023   Peripheral tear of lateral meniscus of right knee as current injury 05/29/2023   Seasonal allergies 07/18/2016   Attention and concentration deficit 06/30/2015    REFERRING PROVIDER: Genelle, MD  REFERRING DIAG:  S83.511A (ICD-10-CM) - Rupture of anterior cruciate ligament of right knee, initial encounter    Post op 6/25  PROCEDURE: 1.  Right knee anterior cruciate ligament reconstruction with quadriceps tendon autograft. 2.  Right knee lateral meniscal repair  Rationale for Evaluation and Treatment:  Rehabilitation  THERAPY DIAG:  Acute pain of right knee  Muscle weakness (generalized)  Difficulty in walking, not elsewhere classified  ONSET DATE: DOS 05/29/23  Days since surgery: 198  SUBJECTIVE:                                                                                                                                                                                           SUBJECTIVE STATEMENT: Patient has no complaints today.  He reports he has been doing his exercises consistently.  He had no significant pain.  PERTINENT HISTORY:  none  PAIN:  Are you having pain? No  PRECAUTIONS:  Knee  WEIGHT BEARING RESTRICTIONS:  Yes POSTOPERATIVE PLAN: He will be weightbearing as tolerated on the right leg with the brace locked in extension.  He begin physical therapy immediately postop.    FALLS:  Has patient fallen in last 6 months? No  OCCUPATION:  student  PLOF:  Independent  PATIENT GOALS:  Back to PLOF  OBJECTIVE:   PATIENT SURVEYS:  LEFS EVAL: 33 LEFS 9/4: 95%  LEFS 10/4: 97%  ACL RSI: 88%   LOWER EXTREMITY ROM:   Passive ROM Right eval Left eval Right  9/4 10/9  Hip flexion        Hip extension        Hip abduction        Hip adduction        Hip internal rotation        Hip external rotation        Knee flexion    114 passive, 110 active 130  Knee extension    0 0  Ankle dorsiflexion        Ankle plantarflexion        Ankle inversion        Ankle eversion         (Blank rows = not tested)    MMT 9/4: R knee extension: 4/5, R knee flexion: 5/5 9/16: Rt 33.6 lb, Lt 39.1     10/7 R knee ext 5RM 45lbs  L knee ext 5RM 75llbs   Functional Test: 10 yrd sprint [facility]  Sprint to stop: decreased eccentric lowering control; hip strategy   Ybalance test 9/16 Right: fwd-65  Lat- 100  med- 92 Left: fwd- 54 lat- 107 med- 99  Initial strength testing (Meniscal repair may delay initial testing)  Knee Extension 90 degrees  L  _44.6___ R__61.2__ % Deficit _0____  Knee Extension 45 Degrees  L _____48.2  R__45.3___ % Deficit ___8%___  Hamstring 45 degrees L_____ R_____ % Deficit   Hip abduction  L_____ R_____ % Deficit    TREATMENT:     1/9 Therapy perform strength testing on patient today.  Reviewed strength numbers. Leg press 90 pounds double leg 3 x 12 Leg press single leg 90 pounds 3 x 12  Knee extension 30 pounds double leg 3 x 12 Single-leg knee extension 15 pounds 2 x 12  Assessed ACL confidence test  12/31 Elliptical warm up L8 ( fast, 1 min slower) Outside on grass: dynamic warm up- jogging ballerinas, high knees, butt kicks, back pedaling, side shuffling  Standing quad stretch-3x30seconds R Squats x20 4 corner hops x5 ea direction Jog a few paces then deceleration on R LE (slow then faster) Single leg lateral squat jumps 4x5R Wall sit with L LE lift 4x5  Jump/step off cement wall outdoors and land single leg x10R Seated SLR 4x5 over yoga block  12/19 Elliptical warm up L5 Length of basketball court: dynamic warm up- jogging ballerinas, high knees, butt kicks, back pedaling, side shuffling  Standing quad stretch-3x30seconds 4 corner hops x5 ea direction Sprint to sudden deceleration on RLE Jog a few paces then deceleration on R LE Single leg lateral squat jumps Broad jumps half court x2 Wall sit with L LE lift 2x10 Squat on toes x5 then single jump with land on toes x5 rounds Broad jumps double to single leg (cues to land on ball of foot) Jump/step off 18inch plyoblock upstairs, land single leg x10 HSC with physioball 2x15 Seated  SLR 2x10 over midline   12/11 Elliptical warm up L5 Track upstairs dynamic warm up- ballerinas, high knees, butt kicks, back pedaling Fwd.back hopping 2x15 Lateral hopping 2x15 Squat jumps x10 Single leg hops x10 Broad jumps double to single leg (cues to land on ball of foot) Jump/step off 18inch plyoblock, land  single leg Runner's lunge with heel raise 4 aerobic step HSC with physioball 2x10 Seated SLR 2x10  10/28  5 min track jog   Sprint build up 2x each up with 100% 20yards  SL split jump 4x6  Wide curve run around turf windown; run 2x3 100%; small arc run with touch and turn 75%  Free throw line box drill 2x4 beach direction                       PATIENT EDUCATION:  Education details: Teacher, Music of condition, POC, HEP, exercise form/rationale Person educated: Patient and Parent Education method: Explanation, Demonstration, Tactile cues, Verbal cues, and Handouts Education comprehension: verbalized understanding, returned demonstration, verbal cues required, tactile cues required, and needs further education  HOME EXERCISE PROGRAM: [id number]   ASSESSMENT:  CLINICAL IMPRESSION: Patient appears to be making good progress.  His strength bilateral is within 80% of his nonsurgical leg.  He scored well on his ACL confidence test.  We will continue to work on single-leg and plyometric exercises.  Because of scores on tests we will consider beginning late cutting activity when cleared by MD.  He would benefit from further skilled therapy 2 W12 to continued to progress towards return to sporting activity.  See below for goal specific progress. He scored an 88 on the ACL-R    REHAB POTENTIAL: Good  CLINICAL DECISION MAKING: Stable/uncomplicated  EVALUATION COMPLEXITY: Low   GOALS: Goals reviewed with patient? Yes  SHORT TERM GOALS: Target date:01/24/2024    Full avail knee ROM Baseline: 0 ext at eval, locked in ext Goal status: achieved  2.  10 SLR without quad lag, without brace Baseline: unable at eval Goal status: achieved  3.  Proper gait pattern with brace donned Baseline: unable at eval, locked in extension Goal status: achieved  4.  Patient will begin weight cutting activity  baseline initial 1/9  5.  Patient will score within 80% of nonsurgical leg single-leg hop  testing Baseline initial 1/9   LONG TERM GOALS: Target date: 03/06/2024    LEFS to improve to at least 66/80 Baseline: 33 at eval Goal status: MET 9/4  2.  LE MMT 85% of opp LE via hand held dynamometry testing Baseline: will test as appropriate Goal status: Achieved 1/9 3.  Proper form with plyometrics & running Baseline: unable at eval Goal status: IN PROGRESS improving 1/9  4.  Able to navigate balance on unstable surfaces without pain or compensation Baseline: unable at eval Goal status: IN PROGRESS 9/4  5.  Able to navigate stairs without pain or compensation Baseline: unable at eval Goal status: MET 8/19  6. Pt will be able to demonstrate ability to run/sprint/cut without movement deviations in order to demonstrate functional improvement and tolerance to higher plyometric loading.  Goal status:IN PROGRESS 1/9  7. Pt will be able to demonstrate 90% QI  in order to demonstrate functional improvement in LE function for return to PLOF.   Goal status: INITIAL  8 Patint will pass return to sport testing battery  Initial 1/9    PLAN:  PT FREQUENCY: 1-2x/week  PT DURATION: POC date  PLANNED INTERVENTIONS: Therapeutic  exercises, Therapeutic activity, Neuromuscular re-education, Balance training, Gait training, Patient/Family education, Self Care, Joint mobilization, Stair training, Aquatic Therapy, Dry Needling, Electrical stimulation, Spinal mobilization, Cryotherapy, Moist heat, scar mobilization, Taping, Vasopneumatic device, Manual therapy, and Re-evaluation.  PLAN FOR NEXT SESSION: per protocol  Alm Don, PT, DPT  12/13/23 9:22 PM      Check all possible CPT codes: 02835 - PT Re-evaluation, 97110- Therapeutic Exercise, [phone]- Neuro Re-education, [phone] - Gait Training, [phone] - Manual Therapy, 97530 - Therapeutic Activities, [phone] - Self Care, [phone] - Electrical stimulation (unattended), [id number] - Vaso, [id number] - Physical performance training, and [phone] -  Aquatic therapy    Check all conditions that are expected to impact treatment: {Conditions expected to impact treatment:None of these apply   If treatment provided at initial evaluation, no treatment charged due to lack of authorization.

## 2023-12-14 ENCOUNTER — Other Ambulatory Visit (HOSPITAL_BASED_OUTPATIENT_CLINIC_OR_DEPARTMENT_OTHER): Payer: Self-pay | Admitting: Orthopaedic Surgery

## 2023-12-19 ENCOUNTER — Ambulatory Visit (HOSPITAL_BASED_OUTPATIENT_CLINIC_OR_DEPARTMENT_OTHER): Payer: BC Managed Care – PPO | Admitting: Physical Therapy

## 2023-12-26 ENCOUNTER — Ambulatory Visit (HOSPITAL_BASED_OUTPATIENT_CLINIC_OR_DEPARTMENT_OTHER): Payer: BC Managed Care – PPO

## 2023-12-27 ENCOUNTER — Encounter (HOSPITAL_BASED_OUTPATIENT_CLINIC_OR_DEPARTMENT_OTHER): Payer: Self-pay

## 2023-12-27 ENCOUNTER — Ambulatory Visit (HOSPITAL_BASED_OUTPATIENT_CLINIC_OR_DEPARTMENT_OTHER): Payer: BC Managed Care – PPO

## 2023-12-27 DIAGNOSIS — M25561 Pain in right knee: Secondary | ICD-10-CM | POA: Diagnosis not present

## 2023-12-27 DIAGNOSIS — M6281 Muscle weakness (generalized): Secondary | ICD-10-CM | POA: Diagnosis not present

## 2023-12-27 DIAGNOSIS — R262 Difficulty in walking, not elsewhere classified: Secondary | ICD-10-CM

## 2023-12-27 NOTE — Therapy (Signed)
OUTPATIENT PHYSICAL THERAPY TREATMENT     Patient Name: Nathaniel Johnson MRN: [id number] DOB:[birth year], 18 y.o., male Today's Date: 12/27/2023  END OF SESSION:  PT End of Session - 12/27/23 1448     Visit Number 21    Number of Visits 27    Date for PT Re-Evaluation 02/07/24    Authorization Type [employer]    PT Start Time 1346    PT Stop Time 1430    PT Time Calculation (min) 44 min    Activity Tolerance Patient tolerated treatment well    Behavior During Therapy [facility] for tasks assessed/performed                      Past Medical History:  Diagnosis Date   Gynecomastia 01/01/2017   Seasonal allergies    Torn ACL    Past Surgical History:  Procedure Laterality Date   KNEE ARTHROSCOPY WITH ANTERIOR CRUCIATE LIGAMENT (ACL) REPAIR WITH HAMSTRING GRAFT Right 05/29/2023   Procedure: RIGHT KNEE ARTHROSCOPY WITH ANTERIOR CRUCIATE LIGAMENT (ACL) RECONSTRUCTION WITH QUADRICEPS AUTOGRAFT;  Surgeon: [name], MD;  Location: [facility];  Service: Orthopedics;  Laterality: Right;   KNEE ARTHROSCOPY WITH MENISCAL REPAIR Right 05/29/2023   Procedure: KNEE ARTHROSCOPY WITH MENISCAL REPAIR LATERAL;  Surgeon: [name], MD;  Location: [facility];  Service: Orthopedics;  Laterality: Right;   NO PAST SURGERIES     Patient Active Problem List   Diagnosis Date Noted   Rupture of anterior cruciate ligament of right knee 05/29/2023   Peripheral tear of lateral meniscus of right knee as current injury 05/29/2023   Seasonal allergies 07/18/2016   Attention and concentration deficit 06/30/2015    REFERRING PROVIDER: Steward Drone, MD  REFERRING DIAG:  S83.511A (ICD-10-CM) - Rupture of anterior cruciate ligament of right knee, initial encounter    Post op 6/25  PROCEDURE: 1.  Right knee anterior cruciate ligament reconstruction with quadriceps tendon autograft. 2.  Right knee lateral meniscal repair  Rationale for Evaluation and  Treatment: Rehabilitation  THERAPY DIAG:  Acute pain of right knee  Muscle weakness (generalized)  Difficulty in walking, not elsewhere classified  ONSET DATE: DOS 05/29/23  Days since surgery: 212  SUBJECTIVE:                                                                                                                                                                                           SUBJECTIVE STATEMENT: No pain at entry.   PERTINENT HISTORY:  none  PAIN:  Are you having pain? No  PRECAUTIONS:  Knee  WEIGHT BEARING RESTRICTIONS:  Yes  POSTOPERATIVE PLAN: He will be weightbearing as tolerated on the right leg with the brace locked in extension.  He begin physical therapy immediately postop.    FALLS:  Has patient fallen in last 6 months? No  OCCUPATION:  student  PLOF:  Independent  PATIENT GOALS:  Back to PLOF  OBJECTIVE:   PATIENT SURVEYS:  LEFS EVAL: 33 LEFS 9/4: 95%  LEFS 10/4: 97%  ACL RSI: 88%   LOWER EXTREMITY ROM:   Passive ROM Right eval Left eval Right  9/4 10/9  Hip flexion        Hip extension        Hip abduction        Hip adduction        Hip internal rotation        Hip external rotation        Knee flexion    114 passive, 110 active 130  Knee extension    0 0  Ankle dorsiflexion        Ankle plantarflexion        Ankle inversion        Ankle eversion         (Blank rows = not tested)    MMT 9/4: R knee extension: 4/5, R knee flexion: 5/5 9/16: Rt 33.6 lb, Lt 39.1     10/7 R knee ext 5RM 45lbs  L knee ext 5RM 75llbs   Functional Test: 10 yrd sprint [facility]  Sprint to stop: decreased eccentric lowering control; hip strategy   Ybalance test 9/16 Right: fwd-65  Lat- 100  med- 92 Left: fwd- 54 lat- 107 med- 99  Initial strength testing (Meniscal repair may delay initial testing)  Knee Extension 90 degrees  L _44.6___ R__61.2__ % Deficit _0____  Knee Extension 45 Degrees  L _____48.2  R__45.3___ %  Deficit ___8%___  Hamstring 45 degrees L_____ R_____ % Deficit   Hip abduction  L_____ R_____ % Deficit    TREATMENT:       1/23 Elliptical warm up L8 1 min fast, then 1 min slow Length of hallway: dynamic warm up- jogging, ballerinas, high knees, butt kicks,   Length of basketball court-side shuffle with basketball dribble  Back pedaling x4 laps  Standing quad stretch-3x30secondsR 4 corner hops x5 ea direction Switch lunges 2x10ea Close range free throws standing on RLE RLE layups  Jog a few paces then deceleration on R LE Sprint then hard deceleration on R LE (limited by footwear) Single leg lateral squat jumps Single leg standard squat jumps 2x10 Broad jumps half court x2 Broad jumps double to single leg (cues to land on ball of foot)    1/9 Therapy perform strength testing on patient today.  Reviewed strength numbers. Leg press 90 pounds double leg 3 x 12 Leg press single leg 90 pounds 3 x 12  Knee extension 30 pounds double leg 3 x 12 Single-leg knee extension 15 pounds 2 x 12  Assessed ACL confidence test  12/31 Elliptical warm up L8 ( fast, 1 min slower) Outside on grass: dynamic warm up- jogging ballerinas, high knees, butt kicks, back pedaling, side shuffling  Standing quad stretch-3x30seconds R Squats x20 4 corner hops x5 ea direction Jog a few paces then deceleration on R LE (slow then faster) Single leg lateral squat jumps 4x5R Wall sit with L LE lift 4x5  Jump/step off cement wall outdoors and land single leg x10R Seated SLR 4x5 over yoga block  12/19 Elliptical warm  up L5 Length of basketball court: dynamic warm up- jogging ballerinas, high knees, butt kicks, back pedaling, side shuffling  Standing quad stretch-3x30seconds 4 corner hops x5 ea direction Sprint to sudden deceleration on RLE Jog a few paces then deceleration on R LE Single leg lateral squat jumps Broad jumps half court x2 Wall sit with L LE lift  2x10 Squat on toes x5 then single jump with land on toes x5 rounds Broad jumps double to single leg (cues to land on ball of foot) Jump/step off 18inch plyoblock upstairs, land single leg x10 HSC with physioball 2x15 Seated SLR 2x10 over midline    PATIENT EDUCATION:  Education details: Anatomy of condition, POC, HEP, exercise form/rationale Person educated: Patient and Parent Education method: Explanation, Demonstration, Tactile cues, Verbal cues, and Handouts Education comprehension: verbalized understanding, returned demonstration, verbal cues required, tactile cues required, and needs further education  HOME EXERCISE PROGRAM: [email or web address]   ASSESSMENT:  CLINICAL IMPRESSION: Continued to work on LE strengthening and [facility] with sport specific exercises. He demonstrates improved confidence in R knee with deleration drills, though he was limited by foot wear today. He fatigues in bil LES with switch lunges and other jumping drills. Noted less R quad tightness today with stretching, though some still present. Pt will benefit from continued agility and strength training.    REHAB POTENTIAL: Good  CLINICAL DECISION MAKING: Stable/uncomplicated  EVALUATION COMPLEXITY: Low   GOALS: Goals reviewed with patient? Yes  SHORT TERM GOALS: Target date:01/24/2024    Full avail knee ROM Baseline: 0 ext at eval, locked in ext Goal status: achieved  2.  10 SLR without quad lag, without brace Baseline: unable at eval Goal status: achieved  3.  Proper gait pattern with brace donned Baseline: unable at eval, locked in extension Goal status: achieved  4.  Patient will begin weight cutting activity  baseline initial 1/9  5.  Patient will score within 80% of nonsurgical leg single-leg hop testing Baseline initial 1/9   LONG TERM GOALS: Target date: 03/06/2024    LEFS to improve to at least 66/80 Baseline: 33 at eval Goal status: MET 9/4  2.  LE MMT 85% of opp LE via hand held  dynamometry testing Baseline: will test as appropriate Goal status: Achieved 1/9 3.  Proper form with plyometrics & running Baseline: unable at eval Goal status: IN PROGRESS improving 1/9  4.  Able to navigate balance on unstable surfaces without pain or compensation Baseline: unable at eval Goal status: IN PROGRESS 9/4  5.  Able to navigate stairs without pain or compensation Baseline: unable at eval Goal status: MET 8/19  6. Pt will be able to demonstrate ability to run/sprint/cut without movement deviations in order to demonstrate functional improvement and tolerance to higher plyometric loading.  Goal status:IN PROGRESS 1/9  7. Pt will be able to demonstrate 90% QI  in order to demonstrate functional improvement in LE function for return to PLOF.   Goal status: INITIAL  8 Patint will pass return to sport testing battery  Initial 1/9    PLAN:  PT FREQUENCY: 1-2x/week  PT DURATION: POC date  PLANNED INTERVENTIONS: Therapeutic exercises, Therapeutic activity, Neuromuscular re-education, Balance training, Gait training, Patient/Family education, Self Care, Joint mobilization, Stair training, Aquatic Therapy, Dry Needling, Electrical stimulation, Spinal mobilization, Cryotherapy, Moist heat, scar mobilization, Taping, Vasopneumatic device, Manual therapy, and Re-evaluation.  PLAN FOR NEXT SESSION: Nathaniel protocol  Riki Altes, PTA   12/27/23 3:01 PM      Check all possible  CPT codes: 16109 - PT Re-evaluation, 97110- Therapeutic Exercise, [phone]- Neuro Re-education, [phone] - Gait Training, [phone] - Manual Therapy, 97530 - Therapeutic Activities, [phone] - Self Care, [phone] - Electrical stimulation (unattended), 97016 - Vaso, [id number] - Physical performance training, and [phone] - Aquatic therapy    Check all conditions that are expected to impact treatment: {Conditions expected to impact treatment:None of these apply   If treatment provided at initial evaluation, no treatment  charged due to lack of authorization.

## 2024-01-03 ENCOUNTER — Ambulatory Visit (HOSPITAL_BASED_OUTPATIENT_CLINIC_OR_DEPARTMENT_OTHER): Payer: BC Managed Care – PPO | Admitting: Physical Therapy

## 2024-01-09 ENCOUNTER — Encounter (HOSPITAL_COMMUNITY): Payer: Self-pay

## 2024-01-09 ENCOUNTER — Ambulatory Visit (HOSPITAL_COMMUNITY)
Admission: EM | Admit: 2024-01-09 | Discharge: 2024-01-09 | Disposition: A | Discharge status: Home / Self Care | Payer: Medicaid Other | Attending: Emergency Medicine | Admitting: Emergency Medicine

## 2024-01-09 DIAGNOSIS — J0100 Acute maxillary sinusitis, unspecified: Secondary | ICD-10-CM | POA: Diagnosis not present

## 2024-01-09 MED ORDER — AMOXICILLIN-POT CLAVULANATE 875-125 MG PO TABS: 1.0000 | ORAL_TABLET | Freq: Two times a day (BID) | ORAL | 0 refills | Status: AC

## 2024-01-09 NOTE — Discharge Instructions (Signed)
Start taking Augmentin twice daily for 7 days for sinus infection. You can continue using daily allergy medication and nasal spray, but I also recommend taking Mucinex of congestion. Return here if symptoms persist.

## 2024-01-09 NOTE — ED Provider Notes (Signed)
 [facility]    CSN: [id number] Arrival date & time: 01/09/24  1305      History   Chief Complaint Chief Complaint  Patient presents with   Nasal Congestion    HPI Nathaniel Johnson is a 18 y.o. male.   Patient presents with persistent congestion and sinus pain/pressure x 3 to 4 days.  Denies fever and cough.  Reports taking Zyrtec  and using Flonase  with minimal relief.     Past Medical History:  Diagnosis Date   Gynecomastia 01/01/2017   Seasonal allergies    Torn ACL     Patient Active Problem List   Diagnosis Date Noted   Rupture of anterior cruciate ligament of right knee 05/29/2023   Peripheral tear of lateral meniscus of right knee as current injury 05/29/2023   Seasonal allergies 07/18/2016   Attention and concentration deficit 06/30/2015    Past Surgical History:  Procedure Laterality Date   KNEE ARTHROSCOPY WITH ANTERIOR CRUCIATE LIGAMENT (ACL) REPAIR WITH HAMSTRING GRAFT Right 05/29/2023   Procedure: RIGHT KNEE ARTHROSCOPY WITH ANTERIOR CRUCIATE LIGAMENT (ACL) RECONSTRUCTION WITH QUADRICEPS AUTOGRAFT;  Surgeon: Genelle Standing, MD;  Location: [facility];  Service: Orthopedics;  Laterality: Right;   KNEE ARTHROSCOPY WITH MENISCAL REPAIR Right 05/29/2023   Procedure: KNEE ARTHROSCOPY WITH MENISCAL REPAIR LATERAL;  Surgeon: Genelle Standing, MD;  Location: [facility];  Service: Orthopedics;  Laterality: Right;   NO PAST SURGERIES         Home Medications    Prior to Admission medications   Medication Sig Start Date End Date Taking? Authorizing Provider  amoxicillin -clavulanate (AUGMENTIN ) 875-125 MG tablet Take 1 tablet by mouth every 12 (twelve) hours. 01/09/24  Yes Johnie, Thurman Sarver A, NP  levocetirizine (XYZAL) 5 MG tablet Take 5 mg by mouth every evening.   Yes [provider]  aspirin  EC 325 MG tablet Take 1 tablet (325 mg total) by mouth daily. 05/29/23   Genelle Standing, MD    Family History Family  History  Problem Relation Age of Onset   Hypertension Mother     Social History Social History   Tobacco Use   Smoking status: Never   Smokeless tobacco: Never  Vaping Use   Vaping status: Never Used  Substance Use Topics   Alcohol use: No   Drug use: Yes    Types: Marijuana    Comment: occas     Allergies   Pollen extract   Review of Systems Review of Systems  Constitutional:  Negative for chills, fatigue and fever.  HENT:  Positive for congestion, sinus pressure and sinus pain. Negative for sore throat.   Respiratory:  Negative for cough and shortness of breath.      Physical Exam Triage Vital Signs ED Triage Vitals  Encounter Vitals Group     BP 01/09/24 1443 112/64     Systolic BP Percentile --      Diastolic BP Percentile --      Pulse Rate 01/09/24 1443 (!) 57     Resp 01/09/24 1443 18     Temp 01/09/24 1443 98.5 F (36.9 C)     Temp Source 01/09/24 1443 Oral     SpO2 01/09/24 1443 98 %     Weight --      Height --      Head Circumference --      Peak Flow --      Pain Score 01/09/24 1440 0     Pain Loc --  Pain Education --      Exclude from Growth Chart --    No data found.  Updated Vital Signs BP 112/64 (BP Location: Left Arm)   Pulse (!) 57   Temp 98.5 F (36.9 C) (Oral)   Resp 18   SpO2 98%   Visual Acuity Right Eye Distance:   Left Eye Distance:   Bilateral Distance:    Right Eye Near:   Left Eye Near:    Bilateral Near:     Physical Exam Vitals and nursing note reviewed.  Constitutional:      General: He is awake. He is not in acute distress.    Appearance: Normal appearance. He is well-developed and well-groomed. He is not ill-appearing.  HENT:     Right Ear: Tympanic membrane, ear canal and external ear normal.     Left Ear: Tympanic membrane, ear canal and external ear normal.     Nose: Congestion and rhinorrhea present.     Right Sinus: Maxillary sinus tenderness present.     Left Sinus: Maxillary sinus  tenderness present.     Mouth/Throat:     Mouth: Mucous membranes are moist.     Pharynx: Posterior oropharyngeal erythema present. No oropharyngeal exudate.  Musculoskeletal:     Cervical back: Normal range of motion and neck supple.  Skin:    General: Skin is warm and dry.  Neurological:     Mental Status: He is alert.  Psychiatric:        Behavior: Behavior is cooperative.      UC Treatments / Results  Labs (all labs ordered are listed, but only abnormal results are displayed) Labs Reviewed - No data to display  EKG   Radiology No results found.  Procedures Procedures (including critical care time)  Medications Ordered in UC Medications - No data to display  Initial Impression / Assessment and Plan / UC Course  I have reviewed the triage vital signs and the nursing notes.  Pertinent labs & imaging results that were available during my care of the patient were reviewed by me and considered in my medical decision making (see chart for details).     Patient presented with 3 to 4-day history of persistent congestion and sinus pain/pressure.  Denies any other symptoms.  Upon assessment congestion and rhinorrhea are present, mild erythema noted to pharynx.  Bilateral maxillary sinus tenderness noted.  Prescribed Augmentin  for sinusitis.  Discussed return precaution Final Clinical Impressions(s) / UC Diagnoses   Final diagnoses:  Acute non-recurrent maxillary sinusitis     Discharge Instructions      Start taking Augmentin  twice daily for 7 days for sinus infection. You can continue using daily allergy medication and nasal spray, but I also recommend taking Mucinex of congestion. Return here if symptoms persist.     ED Prescriptions     Medication Sig Dispense Auth. Provider   amoxicillin -clavulanate (AUGMENTIN ) 875-125 MG tablet Take 1 tablet by mouth every 12 (twelve) hours. 14 tablet Johnie Flaming A, NP      PDMP not reviewed this encounter.    Johnie Flaming A, NP 01/09/24 [phone]

## 2024-01-09 NOTE — ED Triage Notes (Signed)
 Pt c/o of nasal congestion that has made it hard to breathe through his nose and to sleep that started 2-3 days ago.   Home Interventions: Flonase /Zyrtec , with little relief

## 2024-01-12 ENCOUNTER — Ambulatory Visit (HOSPITAL_BASED_OUTPATIENT_CLINIC_OR_DEPARTMENT_OTHER): Payer: Medicaid Other | Attending: Orthopaedic Surgery | Admitting: Physical Therapy

## 2024-01-12 DIAGNOSIS — R262 Difficulty in walking, not elsewhere classified: Secondary | ICD-10-CM | POA: Insufficient documentation

## 2024-01-12 DIAGNOSIS — M6281 Muscle weakness (generalized): Secondary | ICD-10-CM | POA: Insufficient documentation

## 2024-01-12 DIAGNOSIS — M25561 Pain in right knee: Secondary | ICD-10-CM | POA: Insufficient documentation

## 2024-01-14 ENCOUNTER — Encounter (HOSPITAL_BASED_OUTPATIENT_CLINIC_OR_DEPARTMENT_OTHER): Payer: Self-pay

## 2024-01-14 ENCOUNTER — Ambulatory Visit (HOSPITAL_BASED_OUTPATIENT_CLINIC_OR_DEPARTMENT_OTHER): Payer: Medicaid Other | Admitting: Orthopaedic Surgery

## 2024-01-16 ENCOUNTER — Ambulatory Visit (HOSPITAL_BASED_OUTPATIENT_CLINIC_OR_DEPARTMENT_OTHER): Payer: Medicaid Other | Admitting: Physical Therapy

## 2024-01-30 ENCOUNTER — Ambulatory Visit (HOSPITAL_BASED_OUTPATIENT_CLINIC_OR_DEPARTMENT_OTHER): Payer: Medicaid Other | Admitting: Physical Therapy

## 2024-02-02 ENCOUNTER — Ambulatory Visit (HOSPITAL_BASED_OUTPATIENT_CLINIC_OR_DEPARTMENT_OTHER): Payer: Medicaid Other | Attending: Orthopaedic Surgery | Admitting: Physical Therapy

## 2024-02-02 ENCOUNTER — Encounter (HOSPITAL_BASED_OUTPATIENT_CLINIC_OR_DEPARTMENT_OTHER): Payer: Self-pay | Admitting: Physical Therapy

## 2024-02-02 DIAGNOSIS — M25561 Pain in right knee: Secondary | ICD-10-CM | POA: Insufficient documentation

## 2024-02-02 DIAGNOSIS — M6281 Muscle weakness (generalized): Secondary | ICD-10-CM | POA: Insufficient documentation

## 2024-02-02 DIAGNOSIS — R262 Difficulty in walking, not elsewhere classified: Secondary | ICD-10-CM | POA: Insufficient documentation

## 2024-02-02 NOTE — Therapy (Signed)
 OUTPATIENT PHYSICAL THERAPY TREATMENT     Patient Name: Nathaniel Johnson MRN: [id number] DOB:[birth year], 18 y.o., male Today's Date: 02/02/2024  PHYSICAL THERAPY DISCHARGE SUMMARY  Visits from Start of Care: 22  Current functional level related to goals / functional outcomes: See below   Remaining deficits: See below   Education / Equipment: See below   Patient agrees to discharge. Patient goals were met. Patient is being discharged due to meeting the stated rehab goals.   END OF SESSION:  PT End of Session - 02/02/24 0913     Visit Number 22    Number of Visits 27    Date for PT Re-Evaluation 02/07/24    Authorization Type [employer]    PT Start Time 0915    PT Stop Time 0955    PT Time Calculation (min) 40 min    Activity Tolerance Patient tolerated treatment well    Behavior During Therapy [facility] for tasks assessed/performed                      Past Medical History:  Diagnosis Date   Gynecomastia 01/01/2017   Seasonal allergies    Torn ACL    Past Surgical History:  Procedure Laterality Date   KNEE ARTHROSCOPY WITH ANTERIOR CRUCIATE LIGAMENT (ACL) REPAIR WITH HAMSTRING GRAFT Right 05/29/2023   Procedure: RIGHT KNEE ARTHROSCOPY WITH ANTERIOR CRUCIATE LIGAMENT (ACL) RECONSTRUCTION WITH QUADRICEPS AUTOGRAFT;  Surgeon: [name], MD;  Location: [facility];  Service: Orthopedics;  Laterality: Right;   KNEE ARTHROSCOPY WITH MENISCAL REPAIR Right 05/29/2023   Procedure: KNEE ARTHROSCOPY WITH MENISCAL REPAIR LATERAL;  Surgeon: [name], MD;  Location: [facility];  Service: Orthopedics;  Laterality: Right;   NO PAST SURGERIES     Patient Active Problem List   Diagnosis Date Noted   Rupture of anterior cruciate ligament of right knee 05/29/2023   Peripheral tear of lateral meniscus of right knee as current injury 05/29/2023   Seasonal allergies 07/18/2016   Attention and concentration deficit 06/30/2015     REFERRING PROVIDER: Steward Drone, MD  REFERRING DIAG:  S83.511A (ICD-10-CM) - Rupture of anterior cruciate ligament of right knee, initial encounter    Post op 6/25  PROCEDURE: 1.  Right knee anterior cruciate ligament reconstruction with quadriceps tendon autograft. 2.  Right knee lateral meniscal repair  Rationale for Evaluation and Treatment: Rehabilitation  THERAPY DIAG:  Acute pain of right knee  Muscle weakness (generalized)  Difficulty in walking, not elsewhere classified  ONSET DATE: DOS 05/29/23  Days since surgery: 249  SUBJECTIVE:  SUBJECTIVE STATEMENT: Patient states knee has been feeling a lot better. Doing good workouts. Able to do running, cutting, jumping without issue. Feels like he should be almost done with PT.   PERTINENT HISTORY:  none  PAIN:  Are you having pain? No  PRECAUTIONS:  Knee  WEIGHT BEARING RESTRICTIONS:  Yes POSTOPERATIVE PLAN: He will be weightbearing as tolerated on the right leg with the brace locked in extension.  He begin physical therapy immediately postop.    FALLS:  Has patient fallen in last 6 months? No  OCCUPATION:  student  PLOF:  Independent  PATIENT GOALS:  Back to PLOF  OBJECTIVE:   PATIENT SURVEYS:  LEFS EVAL: 33 LEFS 9/4: 95%  LEFS 10/4: 97%  ACL RSI: 88%   02/02/24: LEFS 80/80  ACL RSI: 99%  LOWER EXTREMITY ROM:   Passive ROM Right eval Left eval Right  9/4 10/9  Hip flexion        Hip extension        Hip abduction        Hip adduction        Hip internal rotation        Hip external rotation        Knee flexion    114 passive, 110 active 130  Knee extension    0 0  Ankle dorsiflexion        Ankle plantarflexion        Ankle inversion        Ankle eversion         (Blank rows = not tested)    MMT  9/4: R knee extension: 4/5, R knee flexion: 5/5 9/16: Rt 33.6 lb, Lt 39.1     10/7 R knee ext 5RM 45lbs  L knee ext 5RM 75llbs   Functional Test: 10 yrd sprint [facility]  Sprint to stop: decreased eccentric lowering control; hip strategy   Ybalance test 9/16 Right: fwd-65  Lat- 100  med- 92 Left: fwd- 54 lat- 107 med- 99  Initial strength testing (Meniscal repair may delay initial testing)  Knee Extension 90 degrees  L _44.6___ R__61.2__ % Deficit _0____  Knee Extension 45 Degrees  L _____48.2  R__45.3___ % Deficit ___8%___  Hamstring 45 degrees L_____ R_____ % Deficit   Hip abduction  L_____ R_____ % Deficit    02/02/24: reassessment Knee Extension 90 degrees  L _62.6___ R__65.6__ % Deficit _0____  Knee Extension 45 Degrees  L _____66.1  R__52.4___ % Deficit ___20 %___  Ybalance test 02/02/24 Right: fwd 92  Lat- 115  med- 108 Left: fwd- 91 lat- 122 med- 104  Single leg hop for distance   Surgical __210cm___   Non-Surgical __185 cm____      Triple hop for distance    Surgical __455 cm___    Non-Surgical __440 cm____   Lateral Single leg Hop for Distance    Surgical __146____    No-surgical _138____     T test - run/ cut with good form  TREATMENT:     02/02/24 Reassessment   1/23 Elliptical warm up L8 1 min fast, then 1 min slow Length of hallway: dynamic warm up- jogging, ballerinas, high knees, butt kicks,   Length of basketball court-side shuffle with basketball dribble  Back pedaling x4 laps  Standing quad stretch-3x30secondsR 4 corner hops x5 ea direction Switch lunges 2x10ea Close range free throws standing on RLE RLE layups  Jog a few paces then deceleration on R LE Sprint then hard deceleration  on R LE (limited by footwear) Single leg lateral squat jumps Single leg standard squat jumps 2x10 Broad jumps half court x2 Broad jumps double to single leg (cues to land on ball of foot)    1/9 Therapy perform  strength testing on patient today.  Reviewed strength numbers. Leg press 90 pounds double leg 3 x 12 Leg press single leg 90 pounds 3 x 12  Knee extension 30 pounds double leg 3 x 12 Single-leg knee extension 15 pounds 2 x 12  Assessed ACL confidence test  12/31 Elliptical warm up L8 ( fast, 1 min slower) Outside on grass: dynamic warm up- jogging ballerinas, high knees, butt kicks, back pedaling, side shuffling  Standing quad stretch-3x30seconds R Squats x20 4 corner hops x5 ea direction Jog a few paces then deceleration on R LE (slow then faster) Single leg lateral squat jumps 4x5R Wall sit with L LE lift 4x5  Jump/step off cement wall outdoors and land single leg x10R Seated SLR 4x5 over yoga block  12/19 Elliptical warm up L5 Length of basketball court: dynamic warm up- jogging ballerinas, high knees, butt kicks, back pedaling, side shuffling  Standing quad stretch-3x30seconds 4 corner hops x5 ea direction Sprint to sudden deceleration on RLE Jog a few paces then deceleration on R LE Single leg lateral squat jumps Broad jumps half court x2 Wall sit with L LE lift 2x10 Squat on toes x5 then single jump with land on toes x5 rounds Broad jumps double to single leg (cues to land on ball of foot) Jump/step off 18inch plyoblock upstairs, land single leg x10 HSC with physioball 2x15 Seated SLR 2x10 over midline    PATIENT EDUCATION:  Education details: Anatomy of condition, POC, HEP, exercise form/rationale Person educated: Patient and Parent Education method: Explanation, Demonstration, Tactile cues, Verbal cues, and Handouts Education comprehension: verbalized understanding, returned demonstration, verbal cues required, tactile cues required, and needs further education  HOME EXERCISE PROGRAM: [email or web address]   ASSESSMENT:  CLINICAL IMPRESSION: Patient has met 5/5 short term goals and 8/8 long term goals with ability to complete HEP and improvement in  symptoms, strength, ROM, activity tolerance, gait, balance, and functional mobility. Patient performing well on all plyo exercises and strength. Good strength and motor control overall. Patient ready to d/c to HEP and is instructed to return if needed.     REHAB POTENTIAL: Good  CLINICAL DECISION MAKING: Stable/uncomplicated  EVALUATION COMPLEXITY: Low   GOALS: Goals reviewed with patient? Yes  SHORT TERM GOALS: Target date:01/24/2024    Full avail knee ROM Baseline: 0 ext at eval, locked in ext Goal status: achieved  2.  10 SLR without quad lag, without brace Baseline: unable at eval Goal status: achieved  3.  Proper gait pattern with brace donned Baseline: unable at eval, locked in extension Goal status: achieved  4.  Patient will begin weight cutting activity  baseline met  5.  Patient will score within 80% of nonsurgical leg single-leg hop testing Baseline MET   LONG TERM GOALS: Target date: 03/06/2024    LEFS to improve to at least 66/80 Baseline: 33 at eval Goal status: MET 9/4  2.  LE MMT 85% of opp LE via hand held dynamometry testing Baseline: will test as appropriate Goal status: Achieved 1/9  3.  Proper form with plyometrics & running Baseline: unable at eval Goal status: MET  4.  Able to navigate balance on unstable surfaces without pain or compensation Baseline: unable at eval Goal status: [facility] 02/02/24  5.  Able to navigate stairs without pain or compensation Baseline: unable at eval Goal status: MET 8/19  6. Pt will be able to demonstrate ability to run/sprint/cut without movement deviations in order to demonstrate functional improvement and tolerance to higher plyometric loading.  Goal status: MET  7. Pt will be able to demonstrate 90% QI  in order to demonstrate functional improvement in LE function for return to PLOF.   Goal status: MET 99 %  8 Patint will pass return to sport testing battery  MET    PLAN:  PT FREQUENCY:  1-2x/week  PT DURATION: POC date  PLANNED INTERVENTIONS: Therapeutic exercises, Therapeutic activity, Neuromuscular re-education, Balance training, Gait training, Patient/Family education, Self Care, Joint mobilization, Stair training, Aquatic Therapy, Dry Needling, Electrical stimulation, Spinal mobilization, Cryotherapy, Moist heat, scar mobilization, Taping, Vasopneumatic device, Manual therapy, and Re-evaluation.  PLAN FOR NEXT SESSION: per protocol   Reola Mosher Elam Ellis, PT 02/02/2024, 9:14 AM       Check all possible CPT codes: 78469 - PT Re-evaluation, 97110- Therapeutic Exercise, [phone]- Neuro Re-education, [phone] - Gait Training, [phone] - Manual Therapy, 97530 - Therapeutic Activities, [phone] - Self Care, [phone] - Electrical stimulation (unattended), [id number] - Vaso, [id number] - Physical performance training, and [phone] - Aquatic therapy    Check all conditions that are expected to impact treatment: {Conditions expected to impact treatment:None of these apply   If treatment provided at initial evaluation, no treatment charged due to lack of authorization.

## 2024-10-06 ENCOUNTER — Encounter: Payer: Self-pay | Admitting: Radiology
